# Patient Record
Sex: Female | Born: 1960 | Race: Black or African American | Hispanic: No | Marital: Single | Smoking: Never smoker
Health system: Southern US, Community
[De-identification: ages and names within clinical notes are randomized; demographics above are authoritative.]

## PROBLEM LIST (undated history)

## (undated) DIAGNOSIS — IMO0002 Reserved for concepts with insufficient information to code with codable children: Secondary | ICD-10-CM

## (undated) DIAGNOSIS — D693 Immune thrombocytopenic purpura: Secondary | ICD-10-CM

## (undated) DIAGNOSIS — R87619 Unspecified abnormal cytological findings in specimens from cervix uteri: Secondary | ICD-10-CM

## (undated) DIAGNOSIS — Z0100 Encounter for examination of eyes and vision without abnormal findings: Secondary | ICD-10-CM

## (undated) DIAGNOSIS — M329 Systemic lupus erythematosus, unspecified: Secondary | ICD-10-CM

## (undated) DIAGNOSIS — D259 Leiomyoma of uterus, unspecified: Secondary | ICD-10-CM

## (undated) DIAGNOSIS — Z9289 Personal history of other medical treatment: Secondary | ICD-10-CM

## (undated) DIAGNOSIS — K649 Unspecified hemorrhoids: Secondary | ICD-10-CM

## (undated) DIAGNOSIS — R195 Other fecal abnormalities: Secondary | ICD-10-CM

## (undated) HISTORY — DX: Other fecal abnormalities: R19.5

## (undated) HISTORY — PX: CARPAL TUNNEL RELEASE: SHX101

## (undated) HISTORY — DX: Encounter for examination of eyes and vision without abnormal findings: Z01.00

## (undated) HISTORY — DX: Unspecified abnormal cytological findings in specimens from cervix uteri: R87.619

## (undated) HISTORY — DX: Personal history of other medical treatment: Z92.89

## (undated) HISTORY — PX: PELVIC LAPAROSCOPY: SHX162

---

## 2009-04-21 ENCOUNTER — Ambulatory Visit: Payer: Self-pay | Admitting: Internal Medicine

## 2009-06-05 ENCOUNTER — Ambulatory Visit (HOSPITAL_BASED_OUTPATIENT_CLINIC_OR_DEPARTMENT_OTHER): Admission: RE | Admit: 2009-06-05 | Discharge: 2009-06-05 | Payer: Self-pay | Admitting: Orthopedic Surgery

## 2009-11-30 ENCOUNTER — Ambulatory Visit: Payer: Self-pay | Admitting: Internal Medicine

## 2010-03-19 ENCOUNTER — Ambulatory Visit (HOSPITAL_BASED_OUTPATIENT_CLINIC_OR_DEPARTMENT_OTHER): Admission: RE | Admit: 2010-03-19 | Discharge: 2010-03-19 | Payer: Self-pay | Admitting: Orthopedic Surgery

## 2010-07-13 ENCOUNTER — Ambulatory Visit: Payer: Self-pay | Admitting: Internal Medicine

## 2010-11-01 LAB — POCT HEMOGLOBIN-HEMACUE: Hemoglobin: 15.2 g/dL — ABNORMAL HIGH (ref 12.0–15.0)

## 2010-11-21 LAB — POCT HEMOGLOBIN-HEMACUE: Hemoglobin: 13.8 g/dL (ref 12.0–15.0)

## 2011-02-10 DIAGNOSIS — M329 Systemic lupus erythematosus, unspecified: Secondary | ICD-10-CM | POA: Insufficient documentation

## 2011-03-19 DIAGNOSIS — M12279 Villonodular synovitis (pigmented), unspecified ankle and foot: Secondary | ICD-10-CM | POA: Insufficient documentation

## 2012-09-14 DIAGNOSIS — Z01419 Encounter for gynecological examination (general) (routine) without abnormal findings: Secondary | ICD-10-CM | POA: Insufficient documentation

## 2015-04-07 ENCOUNTER — Inpatient Hospital Stay
Admission: EM | Admit: 2015-04-07 | Discharge: 2015-04-10 | DRG: 813 | Disposition: A | Discharge status: Home / Self Care | Payer: 59 | Attending: Internal Medicine | Admitting: Internal Medicine

## 2015-04-07 ENCOUNTER — Encounter: Payer: Self-pay | Admitting: Gynecology

## 2015-04-07 ENCOUNTER — Ambulatory Visit (INDEPENDENT_AMBULATORY_CARE_PROVIDER_SITE_OTHER)
Admission: EM | Admit: 2015-04-07 | Discharge: 2015-04-07 | Disposition: A | Discharge status: Home / Self Care | Payer: 59 | Source: Home / Self Care | Attending: Family Medicine | Admitting: Family Medicine

## 2015-04-07 ENCOUNTER — Emergency Department: Payer: 59

## 2015-04-07 ENCOUNTER — Encounter: Payer: Self-pay | Admitting: Emergency Medicine

## 2015-04-07 DIAGNOSIS — M3210 Systemic lupus erythematosus, organ or system involvement unspecified: Secondary | ICD-10-CM | POA: Diagnosis present

## 2015-04-07 DIAGNOSIS — D693 Immune thrombocytopenic purpura: Secondary | ICD-10-CM | POA: Diagnosis present

## 2015-04-07 DIAGNOSIS — D696 Thrombocytopenia, unspecified: Secondary | ICD-10-CM

## 2015-04-07 DIAGNOSIS — R509 Fever, unspecified: Secondary | ICD-10-CM

## 2015-04-07 DIAGNOSIS — R161 Splenomegaly, not elsewhere classified: Secondary | ICD-10-CM | POA: Diagnosis present

## 2015-04-07 DIAGNOSIS — Z888 Allergy status to other drugs, medicaments and biological substances status: Secondary | ICD-10-CM

## 2015-04-07 DIAGNOSIS — R197 Diarrhea, unspecified: Secondary | ICD-10-CM | POA: Diagnosis present

## 2015-04-07 DIAGNOSIS — R21 Rash and other nonspecific skin eruption: Secondary | ICD-10-CM | POA: Diagnosis present

## 2015-04-07 DIAGNOSIS — M62838 Other muscle spasm: Secondary | ICD-10-CM | POA: Diagnosis present

## 2015-04-07 DIAGNOSIS — J029 Acute pharyngitis, unspecified: Secondary | ICD-10-CM | POA: Diagnosis present

## 2015-04-07 DIAGNOSIS — Z9889 Other specified postprocedural states: Secondary | ICD-10-CM | POA: Diagnosis not present

## 2015-04-07 DIAGNOSIS — L930 Discoid lupus erythematosus: Secondary | ICD-10-CM | POA: Diagnosis not present

## 2015-04-07 HISTORY — DX: Leiomyoma of uterus, unspecified: D25.9

## 2015-04-07 HISTORY — DX: Unspecified hemorrhoids: K64.9

## 2015-04-07 HISTORY — DX: Systemic lupus erythematosus, unspecified: M32.9

## 2015-04-07 HISTORY — DX: Reserved for concepts with insufficient information to code with codable children: IMO0002

## 2015-04-07 LAB — CBC WITH DIFFERENTIAL/PLATELET
BASOS ABS: 0 10*3/uL (ref 0–0.1)
Basophils Absolute: 0 10*3/uL (ref 0–0.1)
Basophils Relative: 1 %
Basophils Relative: 0 %
EOS ABS: 0.1 10*3/uL (ref 0–0.7)
Eosinophils Absolute: 0.1 10*3/uL (ref 0–0.7)
Eosinophils Relative: 2 %
HCT: 33.9 % — ABNORMAL LOW (ref 35.0–47.0)
HEMATOCRIT: 30.8 % — AB (ref 35.0–47.0)
HEMOGLOBIN: 11.1 g/dL — AB (ref 12.0–16.0)
HEMOGLOBIN: 10.1 g/dL — AB (ref 12.0–16.0)
LYMPHS ABS: 1.1 10*3/uL (ref 1.0–3.6)
LYMPHS ABS: 1 10*3/uL (ref 1.0–3.6)
Lymphocytes Relative: 23 %
MCH: 28.3 pg (ref 26.0–34.0)
MCH: 28.3 pg (ref 26.0–34.0)
MCHC: 32.9 g/dL (ref 32.0–36.0)
MCHC: 32.8 g/dL (ref 32.0–36.0)
MCV: 86.2 fL (ref 80.0–100.0)
MCV: 86.3 fL (ref 80.0–100.0)
Monocytes Absolute: 0.3 10*3/uL (ref 0.2–0.9)
Monocytes Absolute: 0.3 10*3/uL (ref 0.2–0.9)
Monocytes Relative: 6 %
Monocytes Relative: 6 %
NEUTROS ABS: 3.3 10*3/uL (ref 1.4–6.5)
Neutro Abs: 3.3 10*3/uL (ref 1.4–6.5)
Platelets: 8 10*3/uL — CL (ref 150–440)
Platelets: 5 10*3/uL — CL (ref 150–440)
RBC: 3.93 MIL/uL (ref 3.80–5.20)
RBC: 3.57 MIL/uL — AB (ref 3.80–5.20)
RDW: 14.6 % — ABNORMAL HIGH (ref 11.5–14.5)
RDW: 14.5 % (ref 11.5–14.5)
WBC: 4.7 10*3/uL (ref 3.6–11.0)
WBC: 4.5 10*3/uL (ref 3.6–11.0)

## 2015-04-07 LAB — URIC ACID: URIC ACID, SERUM: 3 mg/dL (ref 2.3–6.6)

## 2015-04-07 LAB — COMPREHENSIVE METABOLIC PANEL
ALBUMIN: 3.5 g/dL (ref 3.5–5.0)
ALK PHOS: 51 U/L (ref 38–126)
ALT: 19 U/L (ref 14–54)
AST: 32 U/L (ref 15–41)
Anion gap: 9 (ref 5–15)
BUN: 11 mg/dL (ref 6–20)
CALCIUM: 8.4 mg/dL — AB (ref 8.9–10.3)
CHLORIDE: 102 mmol/L (ref 101–111)
CO2: 24 mmol/L (ref 22–32)
CREATININE: 0.74 mg/dL (ref 0.44–1.00)
GFR calc Af Amer: >60 mL/min (ref >60)
GFR calc non Af Amer: >60 mL/min (ref >60)
GLUCOSE: 122 mg/dL — AB (ref 65–99)
Potassium: 3.8 mmol/L (ref 3.5–5.1)
SODIUM: 135 mmol/L (ref 135–145)
Total Bilirubin: 0.9 mg/dL (ref 0.3–1.2)
Total Protein: 8 g/dL (ref 6.5–8.1)

## 2015-04-07 LAB — URINALYSIS COMPLETE WITH MICROSCOPIC (ARMC ONLY)
BILIRUBIN URINE: NEGATIVE
Glucose, UA: NEGATIVE mg/dL
HGB URINE DIPSTICK: NEGATIVE
KETONES UR: NEGATIVE mg/dL
LEUKOCYTES UA: NEGATIVE
NITRITE: NEGATIVE
PH: 7 (ref 5.0–8.0)
PROTEIN: NEGATIVE mg/dL
SQUAMOUS EPITHELIAL / LPF: NONE SEEN
Specific Gravity, Urine: 1.009 (ref 1.005–1.030)

## 2015-04-07 LAB — PROTIME-INR
INR: 0.99
Prothrombin Time: 13.3 seconds (ref 11.4–15.0)

## 2015-04-07 LAB — LACTATE DEHYDROGENASE: LDH: 217 U/L — AB (ref 98–192)

## 2015-04-07 LAB — RAPID STREP SCREEN (MED CTR MEBANE ONLY): Streptococcus, Group A Screen (Direct): NEGATIVE

## 2015-04-07 LAB — FIBRINOGEN: FIBRINOGEN: 358 mg/dL (ref 210–470)

## 2015-04-07 MED ORDER — METHYLPREDNISOLONE SODIUM SUCC 125 MG IJ SOLR
80.0000 mg | INTRAMUSCULAR | Status: AC
  Administered 2015-04-07: 23:00:00 80 mg via INTRAVENOUS
  Filled 2015-04-07: qty 2

## 2015-04-07 MED ORDER — ACETAMINOPHEN 325 MG PO TABS: 650.0000 mg | ORAL_TABLET | Freq: Four times a day (QID) | ORAL | Status: DC | PRN

## 2015-04-07 MED ORDER — ONDANSETRON HCL 4 MG PO TABS: 4.0000 mg | ORAL_TABLET | Freq: Four times a day (QID) | ORAL | Status: DC | PRN

## 2015-04-07 MED ORDER — DOCUSATE SODIUM 100 MG PO CAPS
100.0000 mg | ORAL_CAPSULE | Freq: Two times a day (BID) | ORAL | Status: DC
  Filled 2015-04-07 (×2): qty 1

## 2015-04-07 MED ORDER — ACETAMINOPHEN 650 MG RE SUPP: 650.0000 mg | Freq: Four times a day (QID) | RECTAL | Status: DC | PRN

## 2015-04-07 MED ORDER — METHYLPREDNISOLONE SODIUM SUCC 125 MG IJ SOLR
80.0000 mg | Freq: Four times a day (QID) | INTRAMUSCULAR | Status: DC
  Administered 2015-04-08 (×3): 80 mg via INTRAVENOUS
  Filled 2015-04-07 (×3): qty 2

## 2015-04-07 MED ORDER — ONDANSETRON HCL 4 MG/2ML IJ SOLN: 4.0000 mg | Freq: Four times a day (QID) | INTRAMUSCULAR | Status: DC | PRN

## 2015-04-07 MED ORDER — HYDROCODONE-ACETAMINOPHEN 5-325 MG PO TABS: 1.0000 | ORAL_TABLET | ORAL | Status: DC | PRN

## 2015-04-07 MED ORDER — DEXTROSE 5 % IV SOLN
1.0000 g | INTRAVENOUS | Status: DC
  Administered 2015-04-07: 23:00:00 1 g via INTRAVENOUS
  Filled 2015-04-07 (×2): qty 10

## 2015-04-07 NOTE — ED Provider Notes (Signed)
SUBJECTIVE:  Dana [name] is a 54 y.o. female who complains of sore throat and headache for the last week. Denies CP, SOB, N/V/D, abdominal pain, severe headache. Has tried OTC Medication without much relief. Started to take old doxycycline 100mg  one tab daily four days ago. Patient has history of lupus.   OBJECTIVE: She appears well, vital signs are as noted.  General: NAD HEENT: mild pharyngeal erythema, no exudate, no erythema of TMs, no cervical LAD Respiratory: CTA B Cardiology: RRR Abdomen: +BS, NT/ND, no guarding or rebound Neurological: CN II-XII grossly intact   ASSESSMENT:  Pharyngitis, Fever, Thrombocytopenia (?Pancytopenia)  PLAN: Rapid strep test was negative, CBC shows thrombocytopenia (possible pancytopenia), I believe she needs to go to the ER for further evaluation and treatment. I am not sure whether the CBC results are due to the doxycycline she started a few days ago. I have called the ER charge nurse. Patient is stable to travel at this time with assistance from a friend by private vehicle.        Paulina Fusi, MD 04/07/15 [phone]

## 2015-04-07 NOTE — ED Notes (Signed)
Attempted to call report at this time was asked to call back

## 2015-04-07 NOTE — ED Notes (Signed)
Patient states she had sore throat and fever x 1 week, went to urgent care, told she had low platelet count.

## 2015-04-07 NOTE — Discharge Instructions (Signed)
Go to [facility] ER now for further work-up and treatment

## 2015-04-07 NOTE — ED Notes (Signed)
Labs not drawn due to results from [place] showing in computer, IV started.

## 2015-04-07 NOTE — ED Notes (Addendum)
Patient c/o sore throat  X 1 week and headache sometimes and a low grade fever.

## 2015-04-07 NOTE — H&P (Signed)
History and Physical    Dana [name] INO:[id number] DOB: 07-21-1961 DOA: 04/07/2015  Referring physician: Dr. Jacqualine Code PCP: Pcp Not In System  Specialists: none  Chief Complaint: Fever  HPI: Dana [name] is a 54 y.o. female has a past medical history significant for lupus who was seen in Urgent Care this AM for fever. Found to be thrombocytopenic. Sent to ER where platelet count was 5K. No bleeding noted. Some vague abdominal pain. Low grade fever.  Review of Systems: The patient denies anorexia,  weight loss,, vision loss, decreased hearing, hoarseness, chest pain, syncope, dyspnea on exertion, peripheral edema, balance deficits, hemoptysis,  melena, hematochezia, severe indigestion/heartburn, hematuria, incontinence, genital sores, muscle weakness, suspicious skin lesions, transient blindness, difficulty walking, depression, unusual weight change, abnormal bleeding, enlarged lymph nodes, angioedema, and breast masses.   Past Medical History  Diagnosis Date  . Lupus   . Uterine fibroid   . Hemorrhoid    Past Surgical History  Procedure Laterality Date  . Pelvic laparoscopy    . Carpal tunnel release      bilateral   Social History:  reports that she has never smoked. She does not have any smokeless tobacco history on file. She reports that she does not drink alcohol or use illicit drugs.  Allergies  Allergen Reactions  . Aspirin     Stomach issue    History reviewed. No pertinent family history.  Prior to Admission medications   Medication Sig Start Date End Date Taking? Authorizing Provider  calcium-vitamin D (OSCAL WITH D) 500-200 MG-UNIT per tablet Take 1 tablet by mouth.    Historical Provider, MD  Multiple Vitamin (MULTIVITAMIN) capsule Take 1 capsule by mouth daily.    Historical Provider, MD   Physical Exam: Filed Vitals:   04/07/15 1621 04/07/15 1956  BP: 140/76 141/70  Pulse: 102 90  Temp: 99.5 F (37.5 C)   TempSrc: Oral   Resp: 20 18  Height: 5\' 8"   (1.727 m)   Weight: 55.792 kg (123 lb)   SpO2: 95% 99%     General:  No apparent distress  Eyes: PERRL, EOMI, no scleral icterus  ENT: moist oropharynx  Neck: supple, no lymphadenopathy  Cardiovascular: regular rate without MRG; 2+ peripheral pulses, no JVD, no peripheral edema  Respiratory: CTA biL, good air movement without wheezing, rhonchi or crackled  Abdomen: soft, non tender to palpation, positive bowel sounds, no guarding, no rebound  Skin: no rashes  Musculoskeletal: normal bulk and tone, no joint swelling  Psychiatric: normal mood and affect  Neurologic: CN 2-12 grossly intact, MS 5/5 in all 4  Labs on Admission:  Basic Metabolic Panel: No results for input(s): NA, K, CL, CO2, GLUCOSE, BUN, CREATININE, CALCIUM, MG, PHOS in the last 168 hours. Liver Function Tests: No results for input(s): AST, ALT, ALKPHOS, BILITOT, PROT, ALBUMIN in the last 168 hours. No results for input(s): LIPASE, AMYLASE in the last 168 hours. No results for input(s): AMMONIA in the last 168 hours. CBC:  Recent Labs Lab 04/07/15 1354 04/07/15 1837  WBC 4.7 4.5  NEUTROABS 3.3 3.3  HGB 11.1* 10.1*  HCT 33.9* 30.8*  MCV 86.2 86.3  PLT 8* 5*   Cardiac Enzymes: No results for input(s): CKTOTAL, CKMB, CKMBINDEX, TROPONINI in the last 168 hours.  BNP (last 3 results) No results for input(s): BNP in the last 8760 hours.  ProBNP (last 3 results) No results for input(s): PROBNP in the last 8760 hours.  CBG: No results for input(s): GLUCAP in  the last 168 hours.  Radiological Exams on Admission: Dg Chest 2 View  04/07/2015   CLINICAL DATA:  Sore throat and low grade fever today.  EXAM: CHEST  2 VIEW  COMPARISON:  None.  FINDINGS: The heart size and mediastinal contours are within normal limits. There is no focal infiltrate, pulmonary edema, or pleural effusion. There is scoliosis of spine.  IMPRESSION: No active cardiopulmonary disease.   Electronically Signed   By: Abelardo Diesel M.D.    On: 04/07/2015 20:32    EKG: Independently reviewed.  Assessment/Plan Principal Problem:   Thrombocytopenia Active Problems:   Fever   Will begin IV steroids and US spleen. Consult Hematology. Hold off on transfusion for now. Specialty labs have been sent off.  Diet: clear liq Fluids: none DVT Prophylaxis: none  Code Status: FULL   Family Communication: yes  Disposition Plan: home  Time spent: 50 min

## 2015-04-07 NOTE — ED Provider Notes (Addendum)
[facility] Emergency Department Provider Note  ____________________________________________  Time seen: Approximately 8:42 PM  I have reviewed the triage vital signs and the nursing notes.   HISTORY  Chief Complaint Abnormal Lab    HPI Dana [name] is a 54 y.o. female history of lupus currently not any medications except doxycycline which is taken for last few days. She reports she had a sore throat for about a week, she reports occasional mild headache, no shortness of breath no nausea no vomiting, occasional loose stool. Continue over-the-counter medication, and went to urgent care today for ongoing symptomatology and feeling generally ill. She had a low-grade temperature she reports that about 100, but otherwise reports she is feeling okay except they're concerned about a low platelet count.   Denies being in pain. She's had occasional slight headaches off and on along with the low-grade temperature.  Past Medical History  Diagnosis Date  . Lupus   . Uterine fibroid   . Hemorrhoid     Patient Active Problem List   Diagnosis Date Noted  . Thrombocytopenia 04/07/2015  . Fever 04/07/2015    Past Surgical History  Procedure Laterality Date  . Pelvic laparoscopy    . Carpal tunnel release      bilateral    Current Outpatient Rx  Name  Route  Sig  Dispense  Refill  . acetaminophen (TYLENOL) 500 MG tablet   Oral   Take 500 mg by mouth every 6 (six) hours as needed.         . calcium-vitamin D (OSCAL WITH D) 500-200 MG-UNIT per tablet   Oral   Take 1 tablet by mouth.         . Multiple Vitamin (MULTIVITAMIN) capsule   Oral   Take 1 capsule by mouth daily.         Marland Kitchen doxycycline (ADOXA) 100 MG tablet   Oral   Take 100 mg by mouth 2 (two) times daily.           Allergies Aspirin and Latex  History reviewed. No pertinent family history.  Social History Social History  Substance Use Topics  . Smoking status: Never Smoker    . Smokeless tobacco: None  . Alcohol Use: No    Review of Systems Constitutional: Low-grade fever Eyes: No visual changes. ENT: No sore throat. Cardiovascular: Denies chest pain. Respiratory: Denies shortness of breath. Gastrointestinal: No abdominal pain.  Some nausea  No diarrhea.  No constipation. Genitourinary: Negative for dysuria. Denies pregnancy. Musculoskeletal: Negative for back pain. Skin: Negative for rash. Neurological: Negative for headaches, focal weakness or numbness.  10-point ROS otherwise negative.  ____________________________________________   PHYSICAL EXAM:  VITAL SIGNS: ED Triage Vitals  Enc Vitals Group     BP 04/07/15 1621 140/76 mmHg     Pulse Rate 04/07/15 1621 102     Resp 04/07/15 1621 20     Temp 04/07/15 1621 99.5 F (37.5 C)     Temp Source 04/07/15 1621 Oral     SpO2 04/07/15 1621 95 %     Weight 04/07/15 1621 123 lb (55.792 kg)     Height 04/07/15 1621 5\' 8"  (1.727 m)     Head Cir --      Peak Flow --      Pain Score 04/07/15 1622 3     Pain Loc --      Pain Edu? --      Excl. in [place]? --     Constitutional: Alert  and oriented. Well appearing and in no acute distress. Eyes: Conjunctivae are normal. PERRL. EOMI. Head: Atraumatic. Nose: No congestion/rhinnorhea. No tonsillar exudates. Mouth/Throat: Mucous membranes are moist.  Oropharynx minimal erythema. Neck: No stridor.   Cardiovascular: Normal rate, regular rhythm. Grossly normal heart sounds.  Good peripheral circulation. Respiratory: Normal respiratory effort.  No retractions. Lungs CTAB. Gastrointestinal: Soft and nontender. No distention. No abdominal bruits. No CVA tenderness. Musculoskeletal: No lower extremity tenderness nor edema.  No joint effusions. Neurologic:  Normal speech and language. No gross focal neurologic deficits are appreciated. Skin:  Skin is warm, dry and intact. No rash noted. No ecchymosis, except for a small area where she had blood drawn  earlier. Psychiatric: Mood and affect are normal. Speech and behavior are normal.  ____________________________________________   LABS (all labs ordered are listed, but only abnormal results are displayed)  Labs Reviewed  CBC WITH DIFFERENTIAL/PLATELET - Abnormal; Notable for the following:    RBC 3.57 (*)    Hemoglobin 10.1 (*)    HCT 30.8 (*)    Platelets 5 (*)    All other components within normal limits  COMPREHENSIVE METABOLIC PANEL - Abnormal; Notable for the following:    Glucose, Bld 122 (*)    Calcium 8.4 (*)    All other components within normal limits  LACTATE DEHYDROGENASE - Abnormal; Notable for the following:    LDH 217 (*)    All other components within normal limits  STOOL CULTURE  STOOL CULTURE  CULTURE, BLOOD (ROUTINE X 2)  CULTURE, BLOOD (ROUTINE X 2)  PROTIME-INR  FIBRINOGEN  URIC ACID  PATHOLOGIST SMEAR REVIEW  LUPUS ANTICOAGULANT PANEL  URINALYSIS COMPLETEWITH MICROSCOPIC ([facility] ONLY)   ____________________________________________  EKG   ____________________________________________  RADIOLOGY   ____________________________________________   PROCEDURES  Procedure(s) performed: None  Critical Care performed: No  ____________________________________________   INITIAL IMPRESSION / ASSESSMENT AND PLAN / ED COURSE  Pertinent labs & imaging results that were available during my care of the patient were reviewed by me and considered in my medical decision making (see chart for details).  Patient presents with generalized viral type symptoms, but noted to have severe Thomas cytopenia. Discussed with Dr. Mike Gip would rises admission, hematology consultation, and give me a battery of lab on which I have done so. In the ER, does not appear that she has any evidence of acute infection other than low-grade temperature, and her exam is very reassuring, denies any bloody or black stools, no altered mental status.  We will clearly admit the  patient, she needs a hematology consultation in follow up on remaining labs which Dr. Doy Hutching will follow up on. Differential diagnosis would certainly include ITP, autoimmune thrombocytopenia, viral suppression, possible malignancy, Escherichia coli HUS. The patient has no altered mental status, I do not see any evidence based on exam of hemolytic uremic syndrome but we are awaiting labs including chemistry creatinine which are internal medicine service will continue to follow on. ____________________________________________   FINAL CLINICAL IMPRESSION(S) / ED DIAGNOSES  Final diagnoses:  Severe thrombocytopenia      Delman Kitten, MD 04/07/15 2046  Delman Kitten, MD 04/07/15 2148

## 2015-04-08 DIAGNOSIS — D696 Thrombocytopenia, unspecified: Secondary | ICD-10-CM

## 2015-04-08 DIAGNOSIS — L930 Discoid lupus erythematosus: Secondary | ICD-10-CM

## 2015-04-08 LAB — TYPE AND SCREEN
ABO/RH(D): O POS
Antibody Screen: NEGATIVE

## 2015-04-08 LAB — COMPREHENSIVE METABOLIC PANEL
ALT: 19 U/L (ref 14–54)
AST: 29 U/L (ref 15–41)
Albumin: 3.2 g/dL — ABNORMAL LOW (ref 3.5–5.0)
Alkaline Phosphatase: 50 U/L (ref 38–126)
Anion gap: 8 (ref 5–15)
BILIRUBIN TOTAL: 0.9 mg/dL (ref 0.3–1.2)
BUN: 9 mg/dL (ref 6–20)
CHLORIDE: 104 mmol/L (ref 101–111)
CO2: 25 mmol/L (ref 22–32)
Calcium: 8.5 mg/dL — ABNORMAL LOW (ref 8.9–10.3)
Creatinine, Ser: 0.73 mg/dL (ref 0.44–1.00)
Glucose, Bld: 143 mg/dL — ABNORMAL HIGH (ref 65–99)
POTASSIUM: 4.5 mmol/L (ref 3.5–5.1)
Sodium: 137 mmol/L (ref 135–145)
TOTAL PROTEIN: 7.9 g/dL (ref 6.5–8.1)

## 2015-04-08 LAB — CBC
HCT: 31.9 % — ABNORMAL LOW (ref 35.0–47.0)
HEMATOCRIT: 32.5 % — AB (ref 35.0–47.0)
Hemoglobin: 10.4 g/dL — ABNORMAL LOW (ref 12.0–16.0)
Hemoglobin: 10.6 g/dL — ABNORMAL LOW (ref 12.0–16.0)
MCH: 27.9 pg (ref 26.0–34.0)
MCH: 28.6 pg (ref 26.0–34.0)
MCHC: 32.1 g/dL (ref 32.0–36.0)
MCHC: 33.3 g/dL (ref 32.0–36.0)
MCV: 86.9 fL (ref 80.0–100.0)
MCV: 86 fL (ref 80.0–100.0)
PLATELETS: 6 10*3/uL — AB (ref 150–440)
Platelets: 7 10*3/uL — CL (ref 150–440)
RBC: 3.74 MIL/uL — ABNORMAL LOW (ref 3.80–5.20)
RBC: 3.71 MIL/uL — ABNORMAL LOW (ref 3.80–5.20)
RDW: 14.6 % — AB (ref 11.5–14.5)
RDW: 14.3 % (ref 11.5–14.5)
WBC: 3.7 10*3/uL (ref 3.6–11.0)
WBC: 3.4 10*3/uL — ABNORMAL LOW (ref 3.6–11.0)

## 2015-04-08 LAB — ABO/RH: ABO/RH(D): O POS

## 2015-04-08 LAB — FOLATE: Folate: 24.2 ng/mL (ref >5.9)

## 2015-04-08 LAB — TSH: TSH: 0.717 u[IU]/mL (ref 0.350–4.500)

## 2015-04-08 MED ORDER — METHYLPREDNISOLONE SODIUM SUCC 125 MG IJ SOLR
60.0000 mg | Freq: Two times a day (BID) | INTRAMUSCULAR | Status: DC
  Administered 2015-04-09 – 2015-04-10 (×3): 60 mg via INTRAVENOUS
  Filled 2015-04-08 (×3): qty 2

## 2015-04-08 MED ORDER — PANTOPRAZOLE SODIUM 40 MG PO TBEC
40.0000 mg | DELAYED_RELEASE_TABLET | Freq: Every day | ORAL | Status: DC
  Administered 2015-04-08: 40 mg via ORAL
  Filled 2015-04-08: qty 1

## 2015-04-08 NOTE — Progress Notes (Signed)
Spoke with Dr. Mike Gip.  Notified of critical Platelet count of 7,000.  MD verbalized understanding.  Orders given to repeat CBC daily, Protonix 40mg  daily, and change Solumedrol to 60mg  q12 hours. Orders entered. [place] 5:59 PM
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
## 2015-04-08 NOTE — Progress Notes (Signed)
[facility] [place] NAME: Dana [name]    MR#:  [id number]  DATE OF BIRTH:  03-11-1961  SUBJECTIVE:  Patient presented with fever at urgent care subsequently noted to have thrombocytopenia. Patient does not take any medications such as Plavix or aspirin. She has no petechiae  REVIEW OF SYSTEMS:    Review of Systems  Constitutional: Negative for fever, chills and malaise/fatigue.  HENT: Negative for sore throat.   Eyes: Negative for blurred vision.  Respiratory: Negative for cough, hemoptysis, shortness of breath and wheezing.   Cardiovascular: Negative for chest pain, palpitations and leg swelling.  Gastrointestinal: Negative for nausea, vomiting, abdominal pain, diarrhea and blood in stool.  Genitourinary: Negative for dysuria.  Musculoskeletal: Negative for back pain.  Neurological: Negative for dizziness, tremors and headaches.  Endo/Heme/Allergies: Does not bruise/bleed easily.    Tolerating Diet: Yes      DRUG ALLERGIES:   Allergies  Allergen Reactions  . Aspirin     Stomach issue  . Latex Swelling    Patient said she can use latex gloves now.    VITALS:  Blood pressure 112/66, pulse 78, temperature 98.4 F (36.9 C), temperature source Oral, resp. rate 18, height 5\' 8"  (1.727 m), weight 58.514 kg (129 lb), SpO2 99 %.  PHYSICAL EXAMINATION:   Physical Exam  Constitutional: She is oriented to person, place, and time and well-developed, well-nourished, and in no distress. No distress.  HENT:  Head: Normocephalic.  Eyes: No scleral icterus.  Neck: Normal range of motion. Neck supple. No JVD present. No tracheal deviation present.  Cardiovascular: Normal rate, regular rhythm and normal heart sounds.  Exam reveals no gallop and no friction rub.   No murmur heard. Pulmonary/Chest: Effort normal and breath sounds normal. No respiratory distress. She has no wheezes. She has no rales. She exhibits no tenderness.   Abdominal: Soft. Bowel sounds are normal. She exhibits no distension and no mass. There is no tenderness. There is no rebound and no guarding.  Musculoskeletal: Normal range of motion. She exhibits no edema.  Neurological: She is alert and oriented to person, place, and time.  Skin: Skin is warm. No rash noted. No erythema.  Psychiatric: Affect and judgment normal.      LABORATORY PANEL:   CBC  Recent Labs Lab 04/08/15 0416  WBC 3.7  HGB 10.4*  HCT 32.5*  PLT 6*   ------------------------------------------------------------------------------------------------------------------  Chemistries   Recent Labs Lab 04/08/15 0416  NA 137  K 4.5  CL 104  CO2 25  GLUCOSE 143*  BUN 9  CREATININE 0.73  CALCIUM 8.5*  AST 29  ALT 19  ALKPHOS 50  BILITOT 0.9   ------------------------------------------------------------------------------------------------------------------  Cardiac Enzymes No results for input(s): TROPONINI in the last 168 hours. ------------------------------------------------------------------------------------------------------------------  RADIOLOGY:  Dg Chest 2 View  04/07/2015   CLINICAL DATA:  Sore throat and low grade fever today.  EXAM: CHEST  2 VIEW  COMPARISON:  None.  FINDINGS: The heart size and mediastinal contours are within normal limits. There is no focal infiltrate, pulmonary edema, or pleural effusion. There is scoliosis of spine.  IMPRESSION: No active cardiopulmonary disease.   Electronically Signed   By: Abelardo Diesel M.D.   On: 04/07/2015 20:32   US Abdomen Complete  04/07/2015   IMPRESSION: 1. Normal spleen size and appearance. 2. Cholelithiasis.   Electronically Signed   By: Monte Fantasia M.D.   On: 04/07/2015 22:13     ASSESSMENT AND PLAN:  54 year old female with a history of lupus who presented to urgent care with fever and subsequently noted to have thrombocytopenia.  1. 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 Lab work is pending at this time. Oncology has been consulted for further evaluation and management. Abdominal ultrasound shows normal spleen. Continue IV steroids.  2. LUPUS: she is not on medications at this time. She follows in [place] [facility] [place].     Management plans discussed with the patient and she is in agreement.  CODE STATUS: FULL  TOTAL TIME TAKING CARE OF THIS PATIENT: 30 minutes.     POSSIBLE D/C 1-2 days, DEPENDING ON CLINICAL CONDITION.   Izzabelle Bouley M.D on 04/08/2015 at 9:18 AM  Between 7am to 6pm - Pager - [phone] After 6pm go to [email or web address] - password [email or web address] [facility]  Office  [phone]  CC: Primary care physician; Pcp Not In System

## 2015-04-08 NOTE — Plan of Care (Signed)
Problem: Discharge Progression Outcomes Goal: Other Discharge Outcomes/Goals Outcome: Progressing Patient admitted for thrombocytopenia platelet count this morning is 6, no transfusion ordered at this time, no active bleeding noted, stool culture sent to lab results pending. VSS, IV antibiotics and IV solumedrol given per order. Continue to monitor.

## 2015-04-09 LAB — CBC WITH DIFFERENTIAL/PLATELET
Basophils Absolute: 0.2 10*3/uL — ABNORMAL HIGH (ref 0–0.1)
Basophils Relative: 3 %
Eosinophils Absolute: 0 10*3/uL (ref 0–0.7)
Eosinophils Relative: 0 %
HCT: 31 % — ABNORMAL LOW (ref 35.0–47.0)
Hemoglobin: 10.2 g/dL — ABNORMAL LOW (ref 12.0–16.0)
Lymphocytes Relative: 9 %
Lymphs Abs: 0.7 10*3/uL — ABNORMAL LOW (ref 1.0–3.6)
MCH: 28.3 pg (ref 26.0–34.0)
MCHC: 32.8 g/dL (ref 32.0–36.0)
MCV: 86.2 fL (ref 80.0–100.0)
Monocytes Absolute: 0.5 10*3/uL (ref 0.2–0.9)
Monocytes Relative: 6 %
Neutro Abs: 6.9 10*3/uL — ABNORMAL HIGH (ref 1.4–6.5)
Neutrophils Relative %: 82 %
Platelets: 10 10*3/uL — CL (ref 150–440)
RBC: 3.59 MIL/uL — ABNORMAL LOW (ref 3.80–5.20)
RDW: 14.6 % — ABNORMAL HIGH (ref 11.5–14.5)
WBC: 8.4 10*3/uL (ref 3.6–11.0)

## 2015-04-09 LAB — VITAMIN B12: Vitamin B-12: 367 pg/mL (ref 180–914)

## 2015-04-09 MED ORDER — BOOST / RESOURCE BREEZE PO LIQD
1.0000 | Freq: Two times a day (BID) | ORAL | Status: DC
  Administered 2015-04-09 – 2015-04-10 (×2): 1 via ORAL

## 2015-04-09 NOTE — Care Management Note (Addendum)
Case Management Note  Patient Details  Name: JONNELL HENTGES MRN: [id number] Date of Birth: [birth year]  Subjective/Objective:   54yo Ms Dealva Lafoy was admitted 04/07/15 per thrombocytopenia and possible lupus flare. PCP=Dr [employer] in [place], [place]. Pharmacy=[facility] in [place]. Resides alone but will have family members staying with her at home after hospital discharge. Currently has no home oxygen and no home equipment. Family and friends will provide transportation to appointments until she is driving again. Case Management will follow for discharge planning.    Action/Plan:   Expected Discharge Date:                  Expected Discharge Plan:     In-House Referral:     Discharge planning Services     Post Acute Care Choice:    Choice offered to:     DME Arranged:    DME Agency:     HH Arranged:    [place] Agency:     Status of Service:     Medicare Important Message Given:    Date Medicare IM Given:    Medicare IM give by:    Date Additional Medicare IM Given:    Additional Medicare Important Message give by:     If discussed at [place] of Stay Meetings, dates discussed:    Additional Comments:  Jatavious Peppard A, RN 04/09/2015, 2:16 PM

## 2015-04-09 NOTE — Progress Notes (Signed)
[place]  [phone] [phone]  ID: Dana [name] OB: [birth year]  MR#: [id number]  YKD#:[id number]  Patient Care Team: Pcp Not In System as PCP - General  CHIEF COMPLAINT:  Chief Complaint  Patient presents with  . Abnormal Lab    low platelet count    INTERVAL HISTORY: Patient doing well today and is up at bedside eating dinner. She had significant cramping overnight which she blames on Protonix, but otherwise is feeling well. She denies any easy bleeding or bruising. She has no neurologic complaints. She denies any chest pain or shortness of breath. Patient otherwise feels well and offers no further specific complaints.   REVIEW OF SYSTEMS:   Review of Systems  Constitutional: Negative.   Endo/Heme/Allergies: Does not bruise/bleed easily.    As per HPI. Otherwise, a complete review of systems is negatve.  PAST MEDICAL HISTORY: Past Medical History  Diagnosis Date  . Lupus   . Uterine fibroid   . Hemorrhoid     PAST SURGICAL HISTORY: Past Surgical History  Procedure Laterality Date  . Pelvic laparoscopy    . Carpal tunnel release      bilateral    FAMILY HISTORY History reviewed. No pertinent family history.     ADVANCED DIRECTIVES:    HEALTH MAINTENANCE: Social History  Substance Use Topics  . Smoking status: Never Smoker   . Smokeless tobacco: None  . Alcohol Use: No     Colonoscopy:  PAP:  Bone density:  Lipid panel:  Allergies  Allergen Reactions  . Aspirin     Stomach issue  . Latex Swelling    Patient said she can use latex gloves now.    Current Facility-Administered Medications  Medication Dose Route Frequency Provider Last Rate Last Dose  . acetaminophen (TYLENOL) tablet 650 mg  650 mg Oral Q6H PRN Idelle Crouch, MD       Or  . acetaminophen (TYLENOL) suppository 650 mg  650 mg Rectal Q6H PRN Idelle Crouch, MD      . docusate sodium (COLACE) capsule 100 mg  100 mg Oral BID Idelle Crouch, MD   100 mg at 04/07/15 2234  . feeding supplement (BOOST / RESOURCE BREEZE) liquid 1 Container  1 Container Oral BID BM Bettey Costa, MD      . HYDROcodone-acetaminophen (NORCO/VICODIN) 5-325 MG per tablet 1-2 tablet  1-2 tablet Oral Q4H PRN Idelle Crouch, MD      . methylPREDNISolone sodium succinate (SOLU-MEDROL) 125 mg/2 mL injection 60 mg  60 mg Intravenous Q12H Lequita Asal, MD   60 mg at 04/09/15 1708  . ondansetron (ZOFRAN) tablet 4 mg  4 mg Oral Q6H PRN Idelle Crouch, MD       Or  . ondansetron [facility]) injection 4 mg  4 mg Intravenous Q6H PRN Idelle Crouch, MD        OBJECTIVE: Filed Vitals:   04/09/15 1151  BP: 128/68  Pulse: 74  Temp: 97.9 F (36.6 C)  Resp: 19     Body mass index is 19.74 kg/(m^2).    ECOG FS:0 - Asymptomatic  General: Well-developed, well-nourished, no acute distress. Eyes: Pink conjunctiva, anicteric sclera. Lungs: Clear to auscultation bilaterally. Heart: Regular rate and rhythm. No rubs, murmurs, or gallops. Abdomen: Soft, nontender, nondistended. No organomegaly noted, normoactive bowel sounds. Musculoskeletal: No edema, cyanosis, or clubbing. Neuro: Alert, answering all questions appropriately. Cranial nerves grossly intact. Skin: No rashes or petechiae noted. Psych: Normal affect.  LAB RESULTS:  Lab Results  Component Value Date   NA 137 04/08/2015   K 4.5 04/08/2015   CL 104 04/08/2015   CO2 25 04/08/2015   GLUCOSE 143* 04/08/2015   BUN 9 04/08/2015   CREATININE 0.73 04/08/2015   CALCIUM 8.5* 04/08/2015   PROT 7.9 04/08/2015   ALBUMIN 3.2* 04/08/2015   AST 29 04/08/2015   ALT 19 04/08/2015   ALKPHOS 50 04/08/2015   BILITOT 0.9 04/08/2015   GFRNONAA >60 04/08/2015   GFRAA >60 04/08/2015    Lab Results  Component Value Date   WBC 8.4 04/09/2015   NEUTROABS 6.9* 04/09/2015   HGB 10.2* 04/09/2015   HCT 31.0* 04/09/2015   MCV 86.2 04/09/2015   PLT 10* 04/09/2015     STUDIES: Dg Chest 2  View  04/07/2015   CLINICAL DATA:  Sore throat and low grade fever today.  EXAM: CHEST  2 VIEW  COMPARISON:  None.  FINDINGS: The heart size and mediastinal contours are within normal limits. There is no focal infiltrate, pulmonary edema, or pleural effusion. There is scoliosis of spine.  IMPRESSION: No active cardiopulmonary disease.   Electronically Signed   By: Abelardo Diesel M.D.   On: 04/07/2015 20:32   US Abdomen Complete  04/07/2015   CLINICAL DATA:  Fever and headache. Sore throat. Thrombocytopenia, evaluate splenic size.  EXAM: ULTRASOUND ABDOMEN COMPLETE  COMPARISON:  None.  FINDINGS: Gallbladder: Stone in the gallbladder neck with gallbladder contraction. No focal tenderness.  Common bile duct: Diameter: 3 mm. Where visualized, no filling defect.  Liver: No focal lesion identified. Within normal limits in parenchymal echogenicity. Antegrade flow in the imaged portal venous system.  IVC: No abnormality visualized.  Pancreas: Visualized portion unremarkable.  Spleen: Size and appearance within normal limits. As requested, splenic size obtained with a 96 cc volume.  Right Kidney: Length: 10 cm. Echogenicity within normal limits. No mass or hydronephrosis visualized.  Left Kidney: Length: 10 cm. Echogenicity within normal limits. No mass or hydronephrosis visualized.  Abdominal aorta: No aneurysm visualized.  IMPRESSION: 1. Normal spleen size and appearance. 2. Cholelithiasis.   Electronically Signed   By: Monte Fantasia M.D.   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  PLAN:    1. ITP: Patient platelet count trending up and is now 10. Continue IV Solu-Medrol as ordered. Previously discussed IVIG. Also can consider Rituxan weekly. Continue to monitor daily CBC, patient will likely remain inpatient until platelet count is greater than 20 followed by outpatient steroid taper. Patient does not require platelet transfusion at this time. Recommend 1 hour posttransfusion  platelet count if transfusion needed. 2. Omeprazole for GI prophylaxis. 3. Guaiac all stools. Stool for Ecoli 0157 4. Labs: Type and screen, B12, folate, TSH, H pylori serologies, immunoglobulins (check IgA if IVIG given), hepatitis B and C testing, HIV testing (patient consented). Follow-up lupus anticoagulant testing   Appreciate consult, will follow.   Lloyd Huger, MD   04/09/2015 6:06 PM

## 2015-04-09 NOTE — Progress Notes (Signed)
   04/09/15 1335  Clinical Encounter Type  Visited With Patient  Visit Type Initial  Spiritual Encounters  Spiritual Needs Prayer  Stress Factors  Patient Stress Factors Health changes  Chaplain rounded in unit and prayed with patient.   Chaplain Ellianne Gowen [phone]

## 2015-04-09 NOTE — Progress Notes (Addendum)
Initial Nutrition Assessment   INTERVENTION:   Meals and Snacks: Cater to patient preferences Medical Food Supplement Therapy: will recommend Boost Breeze po BID, each supplement provides 250 kcal and 9 grams of protein   NUTRITION DIAGNOSIS:   Inadequate oral intake related to poor appetite as evidenced by per patient/family report.  GOAL:   Patient will meet greater than or equal to 90% of their needs  MONITOR:    (Energy Intake, Electrolyte and renal Profile, Anthropometrics, Digestive System)  REASON FOR ASSESSMENT:   Malnutrition Screening Tool    ASSESSMENT:   Pt admitted with low platelets and thrombocytopenia.   Past Medical History  Diagnosis Date  . Lupus   . Uterine fibroid   . Hemorrhoid      Diet Order:  Diet regular Room service appropriate?: Yes; Fluid consistency:: Thin    Current Nutrition: Pt reports po intake much improved since started on steroid since admission. Pt reports eating 100% of beef barley soup and sandwich at lunch, and 100% of breakfast, cream of wheat and egg sandwich this am. Pt eating 100% of lunch and dinner yesterday as well recorded.  Food/Nutrition-Related History: Pt reports for the past week, 'making herself eat.' Pt reports poor appetite but still making sure to eat a little something. Pt reports prior to that having a 'stomach bug,' and not eating well then either.    Medications: Solumedrol, colace  Electrolyte/Renal Profile and Glucose Profile:   Recent Labs Lab 04/07/15 1944 04/08/15 0416  NA 135 137  K 3.8 4.5  CL 102 104  CO2 24 25  BUN 11 9  CREATININE 0.74 0.73  CALCIUM 8.4* 8.5*  GLUCOSE 122* 143*   Protein Profile:  Recent Labs Lab 04/07/15 1944 04/08/15 0416  ALBUMIN 3.5 3.2*   Nutritional Anemia Profile:  CBC Latest Ref Rng 04/09/2015 04/08/2015 04/08/2015  WBC 3.6 - 11.0 K/uL 8.4 3.4(L) 3.7  Hemoglobin 12.0 - 16.0 g/dL 10.2(L) 10.6(L) 10.4(L)  Hematocrit 35.0 - 47.0 % 31.0(L) 31.9(L) 32.5(L)   Platelets 150 - 440 K/uL 10(LL) 7(LL) 6(LL)    Gastrointestinal Profile: Last BM: 04/08/2015  Skin:   (Reviewed, petechiae and jaundice noted)   Nutrition-Focused Physical Exam Findings:  Unable to complete Nutrition-Focused physical exam at this time.    Weight Change: Pt reports 6lbs weight loss for the past couple of weeks, per [facility] weight gain.    Height:   Ht Readings from Last 1 Encounters:  04/07/15 5\' 8"  (1.727 m)    Weight:   Wt Readings from Last 1 Encounters:  04/09/15 129 lb 12.8 oz (58.877 kg)    Wt Readings from Last 10 Encounters:  04/09/15 129 lb 12.8 oz (58.877 kg)  04/07/15 123 lb (55.792 kg)    BMI:  Body mass index is 19.74 kg/(m^2).  Estimated Nutritional Needs:   Kcal:  1755-2074kcals, BEE: 1228kcals, TEE: (IF 1.1-1.3)(AF 1.3)   Protein:  47-59g protein (0.8-1.0g/kg)   Fluid:  1473-1725mL o fluid (25-26mL/kg)   EDUCATION NEEDS:   No education needs identified at this time   [place], RD, LDN Pager [phone]

## 2015-04-09 NOTE — Plan of Care (Signed)
Problem: Discharge Progression Outcomes Goal: Other Discharge Outcomes/Goals Outcome: Progressing Patient admitted for thrombocytopenia platelet count this morning is 10, no transfusion ordered at this time, no active bleeding noted. VSS.  Continue to monitor.

## 2015-04-09 NOTE — Progress Notes (Signed)
[place] at [place] NAME: Jamayia Croker    MR#:  [id number]  DATE OF BIRTH:  1960-09-04  SUBJECTIVE:  Patient says the Protonix caused her muscle spasms at night and was unable to tolerate this and will not take this again. She also has a rash on her face  REVIEW OF SYSTEMS:    Review of Systems  Constitutional: Negative for fever, chills and malaise/fatigue.  HENT: Negative for sore throat.   Eyes: Negative for blurred vision.  Respiratory: Negative for cough, hemoptysis, shortness of breath and wheezing.   Cardiovascular: Negative for chest pain, palpitations and leg swelling.  Gastrointestinal: Negative for nausea, vomiting, abdominal pain, diarrhea and blood in stool.  Genitourinary: Negative for dysuria.  Musculoskeletal: Negative for back pain.  Skin: Positive for rash.       Rash noted on face no petiachae  Neurological: Negative for dizziness, tremors and headaches.  Endo/Heme/Allergies: Does not bruise/bleed easily.    Tolerating Diet: Yes      DRUG ALLERGIES:   Allergies  Allergen Reactions  . Aspirin     Stomach issue  . Latex Swelling    Patient said she can use latex gloves now.    VITALS:  Blood pressure 116/72, pulse 75, temperature 98 F (36.7 C), temperature source Oral, resp. rate 20, height 5\' 8"  (1.727 m), weight 58.877 kg (129 lb 12.8 oz), SpO2 100 %.  PHYSICAL EXAMINATION:   Physical Exam  Constitutional: She is oriented to person, place, and time and well-developed, well-nourished, and in no distress. No distress.  HENT:  Head: Normocephalic.  Eyes: No scleral icterus.  Neck: Normal range of motion. Neck supple. No JVD present. No tracheal deviation present.  Cardiovascular: Normal rate, regular rhythm and normal heart sounds.  Exam reveals no gallop and no friction rub.   No murmur heard. Pulmonary/Chest: Effort normal and breath sounds normal. No respiratory distress. She has no  wheezes. She has no rales. She exhibits no tenderness.  Abdominal: Soft. Bowel sounds are normal. She exhibits no distension and no mass. There is no tenderness. There is no rebound and no guarding.  Musculoskeletal: Normal range of motion. She exhibits no edema.  Neurological: She is alert and oriented to person, place, and time.  Skin: Skin is warm. Rash (face) noted. No erythema.  Psychiatric: Affect and judgment normal.      LABORATORY PANEL:   CBC  Recent Labs Lab 04/09/15 0517  WBC 8.4  HGB 10.2*  HCT 31.0*  PLT 10*   ------------------------------------------------------------------------------------------------------------------  Chemistries   Recent Labs Lab 04/08/15 0416  NA 137  K 4.5  CL 104  CO2 25  GLUCOSE 143*  BUN 9  CREATININE 0.73  CALCIUM 8.5*  AST 29  ALT 19  ALKPHOS 50  BILITOT 0.9   ------------------------------------------------------------------------------------------------------------------  Cardiac Enzymes No results for input(s): TROPONINI in the last 168 hours. ------------------------------------------------------------------------------------------------------------------  RADIOLOGY:  Dg Chest 2 View  04/07/2015   CLINICAL DATA:  Sore throat and low grade fever today.  EXAM: CHEST  2 VIEW  COMPARISON:  None.  FINDINGS: The heart size and mediastinal contours are within normal limits. There is no focal infiltrate, pulmonary edema, or pleural effusion. There is scoliosis of spine.  IMPRESSION: No active cardiopulmonary disease.   Electronically Signed   By: Abelardo Diesel M.D.   On: 04/07/2015 20:32   US Abdomen Complete  04/07/2015   IMPRESSION: 1. Normal spleen size and appearance. 2. Cholelithiasis.  Electronically Signed   By: Monte Fantasia M.D.   On: 04/07/2015 22:13     ASSESSMENT AND PLAN:   54 year old female with a history of lupus who presented to urgent care with fever and subsequently noted to have  thrombocytopenia.  1. 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 Abdominal ultrasound shows normal spleen. Continue IV steroids and assess daily for need for IVIG. Oncology consultation is appreciated. Once platelets are greater than 20,000 patient will be able to be discharged with outpatient steroids taper. H. pylori serologies are pending immunoglobulins are pending. TSH normal   2. LUPUS: she is not on medications at this time.  3. Face rash: Patient does not say this is related to her lupus. I will continue to monitor.     Management plans discussed with the patient and she is in agreement.  CODE STATUS: FULL  TOTAL TIME TAKING CARE OF THIS PATIENT: 30 minutes.     POSSIBLE D/C 1-2 days, DEPENDING ON CLINICAL CONDITION.   Hobert Poplaski M.D on 04/09/2015 at 10:19 AM  Between 7am to 6pm - Pager - [phone] After 6pm go to [email or web address] - password [email or web address] [facility]  Office  [phone]  CC: Primary care physician; Pcp Not In System

## 2015-04-10 ENCOUNTER — Other Ambulatory Visit: Payer: Self-pay | Admitting: Hematology and Oncology

## 2015-04-10 DIAGNOSIS — D693 Immune thrombocytopenic purpura: Secondary | ICD-10-CM

## 2015-04-10 LAB — CBC WITH DIFFERENTIAL/PLATELET
Basophils Absolute: 0 10*3/uL (ref 0–0.1)
Basophils Relative: 0 %
Eosinophils Absolute: 0 10*3/uL (ref 0–0.7)
Eosinophils Relative: 0 %
HCT: 33.1 % — ABNORMAL LOW (ref 35.0–47.0)
Hemoglobin: 10.7 g/dL — ABNORMAL LOW (ref 12.0–16.0)
Lymphocytes Relative: 10 %
Lymphs Abs: 1.2 10*3/uL (ref 1.0–3.6)
MCH: 28.1 pg (ref 26.0–34.0)
MCHC: 32.4 g/dL (ref 32.0–36.0)
MCV: 86.7 fL (ref 80.0–100.0)
Monocytes Absolute: 0.7 10*3/uL (ref 0.2–0.9)
Monocytes Relative: 6 %
Neutro Abs: 10.6 10*3/uL — ABNORMAL HIGH (ref 1.4–6.5)
Neutrophils Relative %: 84 %
Platelets: 26 10*3/uL — CL (ref 150–440)
RBC: 3.82 MIL/uL (ref 3.80–5.20)
RDW: 14.5 % (ref 11.5–14.5)
WBC: 12.6 10*3/uL — ABNORMAL HIGH (ref 3.6–11.0)

## 2015-04-10 LAB — CULTURE, GROUP A STREP (THRC)

## 2015-04-10 MED ORDER — BOOST / RESOURCE BREEZE PO LIQD: 1.0000 | Freq: Two times a day (BID) | ORAL | Status: DC

## 2015-04-10 MED ORDER — PREDNISONE 10 MG PO TABS: 10.0000 mg | ORAL_TABLET | Freq: Every day | ORAL | Status: DC

## 2015-04-10 NOTE — Progress Notes (Signed)
Patient discharged per MD order. Prescription given. All discharge instructions given and all questions answered.

## 2015-04-10 NOTE — Plan of Care (Signed)
Problem: Discharge Progression Outcomes Goal: Other Discharge Outcomes/Goals Plan of care progress to goal: - No complaints of pain. - Ambulates in room. - Small amount of bright red blood in stool this shift. Will continue to monitor.

## 2015-04-10 NOTE — Discharge Summary (Signed)
[place] at [place] NAME: Dana [name]    MR#:  [id number]  DATE OF BIRTH:  27-Jan-1961  DATE OF ADMISSION:  04/07/2015 ADMITTING PHYSICIAN: Idelle Crouch, MD  DATE OF DISCHARGE: 04/10/2015  PRIMARY CARE PHYSICIAN: Pcp Not In System    ADMISSION DIAGNOSIS:  Splenomegaly [R16.1] Severe thrombocytopenia [D69.6]  DISCHARGE DIAGNOSIS:  Principal Problem:   Thrombocytopenia Active Problems:   Fever   SECONDARY DIAGNOSIS:   Past Medical History  Diagnosis Date  . Lupus   . Uterine fibroid   . Hemorrhoid     HOSPITAL COURSE:  54 year old female with a history of lupus who presented to urgent care with fever and subsequently noted to have thrombocytopenia.  1. Thrombocytopenia: This is due to idiopathic thrombocytopenia. Abdominal ultrasound shows normal spleen. Platelet count has improved with IV steroids. Patient will be discharged with steroids taper as advised by oncology. Patient will follow up with oncology at the end of the week.   2. LUPUS: she is not on medications at this time. Patient will follow-up with her rheumatologist in [place]. 3. Face rash: Resolved  DISCHARGE CONDITIONS AND DIET:  Patient's being discharged home in stable condition on a regular diet  CONSULTS OBTAINED:  Treatment Team:  Lequita Asal, MD  DRUG ALLERGIES:   Allergies  Allergen Reactions  . Aspirin     Stomach issue  . Latex Swelling    Patient said she can use latex gloves now.    DISCHARGE MEDICATIONS:   Current Discharge Medication List    START taking these medications   Details  feeding supplement (BOOST / RESOURCE BREEZE) LIQD Take 1 Container by mouth 2 (two) times daily between meals. Qty: 1 Container, Refills: 0    predniSONE (DELTASONE) 10 MG tablet Take 1 tablet (10 mg total) by mouth daily with breakfast. Qty: 40 tablet, Refills: 0      CONTINUE these medications which have NOT CHANGED   Details   acetaminophen (TYLENOL) 500 MG tablet Take 500 mg by mouth every 6 (six) hours as needed.    calcium-vitamin D (OSCAL WITH D) 500-200 MG-UNIT per tablet Take 1 tablet by mouth.    Multiple Vitamin (MULTIVITAMIN) capsule Take 1 capsule by mouth daily.      STOP taking these medications     doxycycline (ADOXA) 100 MG tablet               Today   CHIEF COMPLAINT:  Patient is doing well this morning. No acute events overnight.   VITAL SIGNS:  Blood pressure 131/69, pulse 57, temperature 97.5 F (36.4 C), temperature source Oral, resp. rate 18, height 5\' 8"  (1.727 m), weight 60.646 kg (133 lb 11.2 oz), SpO2 99 %.   REVIEW OF SYSTEMS:  Review of Systems  Constitutional: Negative for fever, chills and malaise/fatigue.  HENT: Negative for sore throat.   Eyes: Negative for blurred vision.  Respiratory: Negative for cough, hemoptysis, shortness of breath and wheezing.   Cardiovascular: Negative for chest pain, palpitations and leg swelling.  Gastrointestinal: Negative for nausea, vomiting, abdominal pain, diarrhea and blood in stool.  Genitourinary: Negative for dysuria.  Musculoskeletal: Negative for back pain.  Neurological: Negative for dizziness, tremors and headaches.  Endo/Heme/Allergies: Does not bruise/bleed easily.     PHYSICAL EXAMINATION:  GENERAL:  54 y.o.-year-old patient lying in the bed with no acute distress.  NECK:  Supple, no jugular venous distention. No thyroid enlargement, no tenderness.  LUNGS: Normal breath  sounds bilaterally, no wheezing, rales,rhonchi  No use of accessory muscles of respiration.  CARDIOVASCULAR: S1, S2 normal. No murmurs, rubs, or gallops.  ABDOMEN: Soft, non-tender, non-distended. Bowel sounds present. No organomegaly or mass.  EXTREMITIES: No pedal edema, cyanosis, or clubbing.  PSYCHIATRIC: The patient is alert and oriented x 3.  SKIN: No obvious rash, lesion, or ulcer.   DATA REVIEW:   CBC  Recent Labs Lab  04/10/15 0415  WBC 12.6*  HGB 10.7*  HCT 33.1*  PLT 26*    Chemistries   Recent Labs Lab 04/08/15 0416  NA 137  K 4.5  CL 104  CO2 25  GLUCOSE 143*  BUN 9  CREATININE 0.73  CALCIUM 8.5*  AST 29  ALT 19  ALKPHOS 50  BILITOT 0.9    Cardiac Enzymes No results for input(s): TROPONINI in the last 168 hours.  Microbiology Results  @[email or web address] @  RADIOLOGY:  No results found.    Management plans discussed with the patient and she is in agreement. Stable for discharge home  Patient should follow up with ONCOLOGY FRIDAY  CODE STATUS:     Code Status Orders        Start     Ordered   04/07/15 2222  Full code   Continuous     04/07/15 2221    Advance Directive Documentation        Most Recent Value   Type of Advance Directive  Healthcare Power of [place], Living will   Pre-existing out of facility DNR order (yellow form or pink MOST form)     "MOST" Form in Place?        TOTAL TIME TAKING CARE OF THIS PATIENT: 35 minutes.    Karely Hurtado M.D on 04/10/2015 at 11:01 AM  Between 7am to 6pm - Pager - [phone] After 6pm go to [email or web address] - password [email or web address] [facility]  Office  [phone]  CC: Primary care physician; Pcp Not In System

## 2015-04-11 ENCOUNTER — Other Ambulatory Visit: Payer: 59

## 2015-04-11 ENCOUNTER — Inpatient Hospital Stay: Payer: 59 | Attending: Hematology and Oncology

## 2015-04-11 DIAGNOSIS — L930 Discoid lupus erythematosus: Secondary | ICD-10-CM | POA: Insufficient documentation

## 2015-04-11 DIAGNOSIS — Z79899 Other long term (current) drug therapy: Secondary | ICD-10-CM | POA: Insufficient documentation

## 2015-04-11 DIAGNOSIS — D696 Thrombocytopenia, unspecified: Secondary | ICD-10-CM | POA: Diagnosis present

## 2015-04-11 DIAGNOSIS — D693 Immune thrombocytopenic purpura: Secondary | ICD-10-CM

## 2015-04-11 LAB — CBC WITH DIFFERENTIAL/PLATELET
Basophils Absolute: 0.1 10*3/uL (ref 0–0.1)
Basophils Relative: 0 %
Eosinophils Absolute: 0 10*3/uL (ref 0–0.7)
Eosinophils Relative: 0 %
HCT: 35.7 % (ref 35.0–47.0)
Hemoglobin: 11.5 g/dL — ABNORMAL LOW (ref 12.0–16.0)
Lymphocytes Relative: 19 %
Lymphs Abs: 2.8 10*3/uL (ref 1.0–3.6)
MCH: 28.1 pg (ref 26.0–34.0)
MCHC: 32.2 g/dL (ref 32.0–36.0)
MCV: 87.1 fL (ref 80.0–100.0)
Monocytes Absolute: 0.8 10*3/uL (ref 0.2–0.9)
Monocytes Relative: 5 %
Neutro Abs: 11.5 10*3/uL — ABNORMAL HIGH (ref 1.4–6.5)
Neutrophils Relative %: 76 %
Platelets: 65 10*3/uL — ABNORMAL LOW (ref 150–440)
RBC: 4.1 MIL/uL (ref 3.80–5.20)
RDW: 15.2 % — ABNORMAL HIGH (ref 11.5–14.5)
WBC: 15.2 10*3/uL — ABNORMAL HIGH (ref 3.6–11.0)

## 2015-04-11 LAB — BASIC METABOLIC PANEL
Anion gap: 8 (ref 5–15)
BUN: 24 mg/dL — ABNORMAL HIGH (ref 6–20)
CO2: 25 mmol/L (ref 22–32)
Calcium: 9.1 mg/dL (ref 8.9–10.3)
Chloride: 103 mmol/L (ref 101–111)
Creatinine, Ser: 0.79 mg/dL (ref 0.44–1.00)
GFR calc Af Amer: >60 mL/min (ref >60)
GFR calc non Af Amer: >60 mL/min (ref >60)
Glucose, Bld: 128 mg/dL — ABNORMAL HIGH (ref 65–99)
Potassium: 4.4 mmol/L (ref 3.5–5.1)
Sodium: 136 mmol/L (ref 135–145)

## 2015-04-12 LAB — CULTURE, BLOOD (ROUTINE X 2)
CULTURE: NO GROWTH
CULTURE: NO GROWTH

## 2015-04-13 ENCOUNTER — Inpatient Hospital Stay (HOSPITAL_BASED_OUTPATIENT_CLINIC_OR_DEPARTMENT_OTHER): Payer: 59 | Admitting: Hematology and Oncology

## 2015-04-13 ENCOUNTER — Encounter: Payer: Self-pay | Admitting: Hematology and Oncology

## 2015-04-13 ENCOUNTER — Other Ambulatory Visit: Payer: Self-pay

## 2015-04-13 ENCOUNTER — Inpatient Hospital Stay: Payer: 59

## 2015-04-13 VITALS — BP 126/82 | HR 75 | Temp 95.9°F | Resp 17 | Ht 68.0 in | Wt 124.1 lb

## 2015-04-13 DIAGNOSIS — Z79899 Other long term (current) drug therapy: Secondary | ICD-10-CM

## 2015-04-13 DIAGNOSIS — D693 Immune thrombocytopenic purpura: Secondary | ICD-10-CM

## 2015-04-13 DIAGNOSIS — D696 Thrombocytopenia, unspecified: Secondary | ICD-10-CM | POA: Diagnosis not present

## 2015-04-13 DIAGNOSIS — L930 Discoid lupus erythematosus: Secondary | ICD-10-CM

## 2015-04-13 LAB — BASIC METABOLIC PANEL
Anion gap: 7 (ref 5–15)
BUN: 24 mg/dL — ABNORMAL HIGH (ref 6–20)
CO2: 24 mmol/L (ref 22–32)
Calcium: 9.1 mg/dL (ref 8.9–10.3)
Chloride: 106 mmol/L (ref 101–111)
Creatinine, Ser: 0.8 mg/dL (ref 0.44–1.00)
GFR calc Af Amer: >60 mL/min (ref >60)
GFR calc non Af Amer: >60 mL/min (ref >60)
Glucose, Bld: 109 mg/dL — ABNORMAL HIGH (ref 65–99)
Potassium: 4.1 mmol/L (ref 3.5–5.1)
Sodium: 137 mmol/L (ref 135–145)

## 2015-04-13 LAB — H PYLORI, IGM, IGG, IGA AB
H Pylori IgG: <0.9 U/mL (ref 0.0–0.8)
H. Pylogi, Iga Abs: <9 units (ref 0.0–8.9)
H. Pylogi, Igm Abs: <9 units (ref 0.0–8.9)

## 2015-04-13 LAB — PTT-LA MIX: PTT-LA MIX: 37.1 s (ref 0.0–50.0)

## 2015-04-13 LAB — CBC WITH DIFFERENTIAL/PLATELET
Basophils Absolute: 0 10*3/uL (ref 0–0.1)
Basophils Relative: 0 %
Eosinophils Absolute: 0 10*3/uL (ref 0–0.7)
Eosinophils Relative: 0 %
HCT: 34.8 % — ABNORMAL LOW (ref 35.0–47.0)
Hemoglobin: 11.3 g/dL — ABNORMAL LOW (ref 12.0–16.0)
Lymphocytes Relative: 23 %
Lymphs Abs: 3.1 10*3/uL (ref 1.0–3.6)
MCH: 28.5 pg (ref 26.0–34.0)
MCHC: 32.5 g/dL (ref 32.0–36.0)
MCV: 87.7 fL (ref 80.0–100.0)
Monocytes Absolute: 0.6 10*3/uL (ref 0.2–0.9)
Monocytes Relative: 5 %
Neutro Abs: 10 10*3/uL — ABNORMAL HIGH (ref 1.4–6.5)
Neutrophils Relative %: 72 %
Platelets: 143 10*3/uL — ABNORMAL LOW (ref 150–440)
RBC: 3.97 MIL/uL (ref 3.80–5.20)
RDW: 15.1 % — ABNORMAL HIGH (ref 11.5–14.5)
WBC: 13.8 10*3/uL — ABNORMAL HIGH (ref 3.6–11.0)

## 2015-04-13 LAB — HEPATITIS B SURFACE ANTIBODY,QUALITATIVE: Hep B S Ab: REACTIVE

## 2015-04-13 LAB — HIV ANTIBODY (ROUTINE TESTING W REFLEX): HIV Screen 4th Generation wRfx: NONREACTIVE

## 2015-04-13 LAB — IGG, IGA, IGM
IgA: 156 mg/dL (ref 87–352)
IgG (Immunoglobin G), Serum: 2894 mg/dL — ABNORMAL HIGH (ref 700–1600)
IgM, Serum: 123 mg/dL (ref 26–217)

## 2015-04-13 LAB — LUPUS ANTICOAGULANT PANEL
DRVVT: 34.5 s (ref 0.0–55.1)
PTT Lupus Anticoagulant: 174 s — ABNORMAL HIGH (ref 0.0–50.0)

## 2015-04-13 LAB — PTT-LA INCUB MIX: PTT-LA INCUB MIX: 41.3 s (ref 0.0–50.0)

## 2015-04-13 LAB — HEPATITIS B CORE ANTIBODY, TOTAL: Hep B Core Total Ab: NEGATIVE

## 2015-04-13 LAB — HEPATITIS C ANTIBODY: HCV Ab: <0.1 s/co ratio (ref 0.0–0.9)

## 2015-04-13 LAB — MAGNESIUM: Magnesium: 2.2 mg/dL (ref 1.7–2.4)

## 2015-04-13 MED ORDER — PREDNISONE 20 MG PO TABS: 60.0000 mg | ORAL_TABLET | Freq: Every day | ORAL | Status: DC

## 2015-04-13 MED ORDER — OMEPRAZOLE 20 MG PO CPDR: 20.0000 mg | DELAYED_RELEASE_CAPSULE | Freq: Every day | ORAL | Status: DC

## 2015-04-13 NOTE — Progress Notes (Signed)
Pt reports no major differences since last visit.  Still having cramps in legs at nighttime
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
## 2015-04-16 ENCOUNTER — Ambulatory Visit: Admission: RE | Admit: 2015-04-16 | Payer: 59 | Source: Ambulatory Visit | Admitting: Gastroenterology

## 2015-04-16 ENCOUNTER — Inpatient Hospital Stay: Payer: 59

## 2015-04-16 ENCOUNTER — Telehealth: Payer: Self-pay

## 2015-04-16 ENCOUNTER — Encounter: Admission: RE | Payer: Self-pay | Source: Ambulatory Visit

## 2015-04-16 DIAGNOSIS — D696 Thrombocytopenia, unspecified: Secondary | ICD-10-CM | POA: Diagnosis not present

## 2015-04-16 LAB — CBC WITH DIFFERENTIAL/PLATELET
Basophils Absolute: 0 10*3/uL (ref 0–0.1)
Basophils Relative: 0 %
Eosinophils Absolute: 0 10*3/uL (ref 0–0.7)
Eosinophils Relative: 0 %
HCT: 33.8 % — ABNORMAL LOW (ref 35.0–47.0)
Hemoglobin: 11 g/dL — ABNORMAL LOW (ref 12.0–16.0)
Lymphocytes Relative: 18 %
Lymphs Abs: 2.5 10*3/uL (ref 1.0–3.6)
MCH: 28.9 pg (ref 26.0–34.0)
MCHC: 32.5 g/dL (ref 32.0–36.0)
MCV: 89.1 fL (ref 80.0–100.0)
Monocytes Absolute: 1.3 10*3/uL — ABNORMAL HIGH (ref 0.2–0.9)
Monocytes Relative: 9 %
Neutro Abs: 10.1 10*3/uL — ABNORMAL HIGH (ref 1.4–6.5)
Neutrophils Relative %: 73 %
Platelets: 196 10*3/uL (ref 150–440)
RBC: 3.8 MIL/uL (ref 3.80–5.20)
RDW: 16.2 % — ABNORMAL HIGH (ref 11.5–14.5)
WBC: 13.9 10*3/uL — ABNORMAL HIGH (ref 3.6–11.0)

## 2015-04-16 SURGERY — COLONOSCOPY WITH PROPOFOL
Anesthesia: General

## 2015-04-16 NOTE — Telephone Encounter (Signed)
Called and spoke with pt.  Per Dr. Mike Gip decrease prednisone to 50 mg a day. Pt verbalized an understanding and stated she would she [place] on 9/7

## 2015-04-17 ENCOUNTER — Inpatient Hospital Stay: Payer: 59 | Admitting: Hematology and Oncology

## 2015-04-24 ENCOUNTER — Other Ambulatory Visit: Payer: 59

## 2015-04-24 ENCOUNTER — Ambulatory Visit: Payer: 59 | Admitting: Hematology and Oncology

## 2015-04-25 ENCOUNTER — Inpatient Hospital Stay (HOSPITAL_BASED_OUTPATIENT_CLINIC_OR_DEPARTMENT_OTHER): Payer: 59 | Admitting: Hematology and Oncology

## 2015-04-25 ENCOUNTER — Inpatient Hospital Stay: Payer: 59 | Attending: Hematology and Oncology

## 2015-04-25 DIAGNOSIS — D693 Immune thrombocytopenic purpura: Secondary | ICD-10-CM | POA: Diagnosis not present

## 2015-04-25 DIAGNOSIS — Z79899 Other long term (current) drug therapy: Secondary | ICD-10-CM

## 2015-04-25 DIAGNOSIS — M329 Systemic lupus erythematosus, unspecified: Secondary | ICD-10-CM | POA: Insufficient documentation

## 2015-04-25 DIAGNOSIS — D696 Thrombocytopenia, unspecified: Secondary | ICD-10-CM | POA: Diagnosis present

## 2015-04-25 LAB — CBC WITH DIFFERENTIAL/PLATELET
Basophils Absolute: 0 10*3/uL (ref 0–0.1)
Basophils Relative: 0 %
Eosinophils Absolute: 0 10*3/uL (ref 0–0.7)
Eosinophils Relative: 0 %
HCT: 37.3 % (ref 35.0–47.0)
Hemoglobin: 11.9 g/dL — ABNORMAL LOW (ref 12.0–16.0)
Lymphocytes Relative: 21 %
Lymphs Abs: 2.4 10*3/uL (ref 1.0–3.6)
MCH: 28.9 pg (ref 26.0–34.0)
MCHC: 32 g/dL (ref 32.0–36.0)
MCV: 90.3 fL (ref 80.0–100.0)
Monocytes Absolute: 0.6 10*3/uL (ref 0.2–0.9)
Monocytes Relative: 5 %
Neutro Abs: 8.5 10*3/uL — ABNORMAL HIGH (ref 1.4–6.5)
Neutrophils Relative %: 74 %
Platelets: 110 10*3/uL — ABNORMAL LOW (ref 150–440)
RBC: 4.13 MIL/uL (ref 3.80–5.20)
RDW: 18.6 % — ABNORMAL HIGH (ref 11.5–14.5)
WBC: 11.5 10*3/uL — ABNORMAL HIGH (ref 3.6–11.0)

## 2015-04-25 NOTE — Progress Notes (Signed)
F/U visit for thrombocytopenia. Pt regaining some energy. Fatigued by lunchtime every day. Has morning hoarseness. Leg cramps nearly gone. No bruising. Denies bleeding gums. No blood in urine or stool. Has not started taking omeprazole. Eating better. Still taking 1 boost daily in creal.
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
## 2015-04-30 ENCOUNTER — Encounter: Payer: Self-pay | Admitting: Hematology and Oncology

## 2015-05-02 ENCOUNTER — Inpatient Hospital Stay (HOSPITAL_BASED_OUTPATIENT_CLINIC_OR_DEPARTMENT_OTHER): Payer: 59 | Admitting: Hematology and Oncology

## 2015-05-02 ENCOUNTER — Inpatient Hospital Stay: Payer: 59

## 2015-05-02 VITALS — BP 161/76 | HR 72 | Temp 95.9°F | Resp 18 | Ht 67.75 in | Wt 126.1 lb

## 2015-05-02 DIAGNOSIS — D693 Immune thrombocytopenic purpura: Secondary | ICD-10-CM

## 2015-05-02 DIAGNOSIS — Z79899 Other long term (current) drug therapy: Secondary | ICD-10-CM | POA: Diagnosis not present

## 2015-05-02 DIAGNOSIS — M329 Systemic lupus erythematosus, unspecified: Secondary | ICD-10-CM | POA: Diagnosis not present

## 2015-05-02 LAB — BASIC METABOLIC PANEL
Anion gap: 9 (ref 5–15)
BUN: 23 mg/dL — ABNORMAL HIGH (ref 6–20)
CO2: 30 mmol/L (ref 22–32)
Calcium: 9.3 mg/dL (ref 8.9–10.3)
Chloride: 100 mmol/L — ABNORMAL LOW (ref 101–111)
Creatinine, Ser: 0.77 mg/dL (ref 0.44–1.00)
GFR calc Af Amer: >60 mL/min (ref >60)
GFR calc non Af Amer: >60 mL/min (ref >60)
Glucose, Bld: 124 mg/dL — ABNORMAL HIGH (ref 65–99)
Potassium: 4.3 mmol/L (ref 3.5–5.1)
Sodium: 139 mmol/L (ref 135–145)

## 2015-05-02 LAB — CBC WITH DIFFERENTIAL/PLATELET
Basophils Absolute: 0.1 10*3/uL (ref 0–0.1)
Basophils Relative: 1 %
Eosinophils Absolute: 0 10*3/uL (ref 0–0.7)
Eosinophils Relative: 0 %
HCT: 40.2 % (ref 35.0–47.0)
Hemoglobin: 13 g/dL (ref 12.0–16.0)
Lymphocytes Relative: 26 %
Lymphs Abs: 2.6 10*3/uL (ref 1.0–3.6)
MCH: 29.4 pg (ref 26.0–34.0)
MCHC: 32.4 g/dL (ref 32.0–36.0)
MCV: 90.7 fL (ref 80.0–100.0)
Monocytes Absolute: 0.5 10*3/uL (ref 0.2–0.9)
Monocytes Relative: 5 %
Neutro Abs: 6.8 10*3/uL — ABNORMAL HIGH (ref 1.4–6.5)
Neutrophils Relative %: 68 %
Platelets: 250 10*3/uL (ref 150–440)
RBC: 4.44 MIL/uL (ref 3.80–5.20)
RDW: 18.5 % — ABNORMAL HIGH (ref 11.5–14.5)
WBC: 9.9 10*3/uL (ref 3.6–11.0)

## 2015-05-02 LAB — URIC ACID: Uric Acid, Serum: 3.7 mg/dL (ref 2.3–6.6)

## 2015-05-02 LAB — LACTATE DEHYDROGENASE: LDH: 127 U/L (ref 98–192)

## 2015-05-02 NOTE — Progress Notes (Signed)
Pt reports having some hoarsness in the mornings that she noticed when she began taking the steroids.  Pt reports having gained a couple of pounds and her night sweats have improved since last visit.
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
## 2015-05-03 LAB — PROTEIN ELECTROPHORESIS, SERUM
A/G Ratio: 0.9 (ref 0.7–1.7)
Albumin ELP: 3.4 g/dL (ref 2.9–4.4)
Alpha-1-Globulin: 0.2 g/dL (ref 0.0–0.4)
Alpha-2-Globulin: 0.9 g/dL (ref 0.4–1.0)
Beta Globulin: 0.9 g/dL (ref 0.7–1.3)
Gamma Globulin: 2 g/dL — ABNORMAL HIGH (ref 0.4–1.8)
Globulin, Total: 3.9 g/dL (ref 2.2–3.9)
Total Protein ELP: 7.3 g/dL (ref 6.0–8.5)

## 2015-05-03 LAB — HEPATITIS B SURFACE ANTIGEN: Hepatitis B Surface Ag: NEGATIVE

## 2015-05-09 ENCOUNTER — Inpatient Hospital Stay: Payer: 59

## 2015-05-09 ENCOUNTER — Encounter: Payer: Self-pay | Admitting: *Deleted

## 2015-05-09 DIAGNOSIS — D696 Thrombocytopenia, unspecified: Secondary | ICD-10-CM

## 2015-05-09 DIAGNOSIS — D693 Immune thrombocytopenic purpura: Secondary | ICD-10-CM | POA: Diagnosis not present

## 2015-05-09 LAB — CBC WITH DIFFERENTIAL/PLATELET
Basophils Absolute: 0 10*3/uL (ref 0–0.1)
Basophils Relative: 1 %
Eosinophils Absolute: 0 10*3/uL (ref 0–0.7)
Eosinophils Relative: 0 %
HCT: 41 % (ref 35.0–47.0)
Hemoglobin: 13.4 g/dL (ref 12.0–16.0)
Lymphocytes Relative: 20 %
Lymphs Abs: 1.9 10*3/uL (ref 1.0–3.6)
MCH: 29.7 pg (ref 26.0–34.0)
MCHC: 32.7 g/dL (ref 32.0–36.0)
MCV: 90.7 fL (ref 80.0–100.0)
Monocytes Absolute: 0.6 10*3/uL (ref 0.2–0.9)
Monocytes Relative: 7 %
Neutro Abs: 6.8 10*3/uL — ABNORMAL HIGH (ref 1.4–6.5)
Neutrophils Relative %: 72 %
Platelets: 230 10*3/uL (ref 150–440)
RBC: 4.51 MIL/uL (ref 3.80–5.20)
RDW: 18.2 % — ABNORMAL HIGH (ref 11.5–14.5)
WBC: 9.4 10*3/uL (ref 3.6–11.0)

## 2015-05-09 NOTE — Progress Notes (Signed)
Pt here in clinic for lab only appt. Platelets are 230 today. Spoke with Dr Mike Gip and she wanted patient to decrease prednisone to 20mg  daily. Gave patient a copy of todays lab results and instructions to decrease prednisone to 20mg  daily. Asked pt if she needed a refill on prednisone she replied that" no she has plenty left."

## 2015-05-16 ENCOUNTER — Ambulatory Visit (HOSPITAL_BASED_OUTPATIENT_CLINIC_OR_DEPARTMENT_OTHER): Payer: 59 | Admitting: Hematology and Oncology

## 2015-05-16 ENCOUNTER — Inpatient Hospital Stay: Payer: 59

## 2015-05-16 VITALS — BP 169/77 | HR 70 | Temp 97.4°F | Resp 17 | Ht 67.0 in | Wt 128.2 lb

## 2015-05-16 DIAGNOSIS — D696 Thrombocytopenia, unspecified: Secondary | ICD-10-CM

## 2015-05-16 DIAGNOSIS — Z79899 Other long term (current) drug therapy: Secondary | ICD-10-CM

## 2015-05-16 DIAGNOSIS — M329 Systemic lupus erythematosus, unspecified: Secondary | ICD-10-CM | POA: Diagnosis not present

## 2015-05-16 DIAGNOSIS — D693 Immune thrombocytopenic purpura: Secondary | ICD-10-CM

## 2015-05-16 LAB — CBC WITH DIFFERENTIAL/PLATELET
Basophils Absolute: 0.1 10*3/uL (ref 0–0.1)
Basophils Relative: 1 %
Eosinophils Absolute: 0 10*3/uL (ref 0–0.7)
Eosinophils Relative: 0 %
HCT: 41.2 % (ref 35.0–47.0)
Hemoglobin: 13.6 g/dL (ref 12.0–16.0)
Lymphocytes Relative: 21 %
Lymphs Abs: 2.1 10*3/uL (ref 1.0–3.6)
MCH: 30 pg (ref 26.0–34.0)
MCHC: 32.9 g/dL (ref 32.0–36.0)
MCV: 91.2 fL (ref 80.0–100.0)
Monocytes Absolute: 0.6 10*3/uL (ref 0.2–0.9)
Monocytes Relative: 6 %
Neutro Abs: 7.1 10*3/uL — ABNORMAL HIGH (ref 1.4–6.5)
Neutrophils Relative %: 72 %
Platelets: 209 10*3/uL (ref 150–440)
RBC: 4.51 MIL/uL (ref 3.80–5.20)
RDW: 17.8 % — ABNORMAL HIGH (ref 11.5–14.5)
WBC: 10 10*3/uL (ref 3.6–11.0)

## 2015-05-16 LAB — STOOL CULTURE: Special Requests: NORMAL

## 2015-05-16 NOTE — Progress Notes (Signed)
Follow up ITP. Night sweats continue to improve.  Energy level has increased.  Report some stress related to mother being sick and not sure see will make in time to see her.  Reports still taking 20mg  prednisone.

## 2015-05-23 ENCOUNTER — Inpatient Hospital Stay: Payer: 59

## 2015-05-30 ENCOUNTER — Inpatient Hospital Stay: Payer: 59 | Attending: Hematology and Oncology

## 2015-05-30 DIAGNOSIS — Z79899 Other long term (current) drug therapy: Secondary | ICD-10-CM | POA: Diagnosis not present

## 2015-05-30 DIAGNOSIS — M329 Systemic lupus erythematosus, unspecified: Secondary | ICD-10-CM | POA: Insufficient documentation

## 2015-05-30 DIAGNOSIS — D693 Immune thrombocytopenic purpura: Secondary | ICD-10-CM | POA: Diagnosis not present

## 2015-05-30 DIAGNOSIS — D696 Thrombocytopenia, unspecified: Secondary | ICD-10-CM

## 2015-05-30 LAB — CBC WITH DIFFERENTIAL/PLATELET
Basophils Absolute: 0 10*3/uL (ref 0–0.1)
Basophils Relative: 0 %
Eosinophils Absolute: 0 10*3/uL (ref 0–0.7)
Eosinophils Relative: 1 %
HCT: 41.8 % (ref 35.0–47.0)
Hemoglobin: 13.6 g/dL (ref 12.0–16.0)
Lymphocytes Relative: 25 %
Lymphs Abs: 1.7 10*3/uL (ref 1.0–3.6)
MCH: 29.6 pg (ref 26.0–34.0)
MCHC: 32.6 g/dL (ref 32.0–36.0)
MCV: 90.8 fL (ref 80.0–100.0)
Monocytes Absolute: 0.6 10*3/uL (ref 0.2–0.9)
Monocytes Relative: 8 %
Neutro Abs: 4.4 10*3/uL (ref 1.4–6.5)
Neutrophils Relative %: 66 %
Platelets: 231 10*3/uL (ref 150–440)
RBC: 4.6 MIL/uL (ref 3.80–5.20)
RDW: 16.5 % — ABNORMAL HIGH (ref 11.5–14.5)
WBC: 6.8 10*3/uL (ref 3.6–11.0)

## 2015-06-06 ENCOUNTER — Ambulatory Visit: Payer: 59 | Admitting: Hematology and Oncology

## 2015-06-06 ENCOUNTER — Telehealth: Payer: Self-pay

## 2015-06-06 ENCOUNTER — Inpatient Hospital Stay: Payer: 59

## 2015-06-06 DIAGNOSIS — D693 Immune thrombocytopenic purpura: Secondary | ICD-10-CM | POA: Diagnosis not present

## 2015-06-06 DIAGNOSIS — D696 Thrombocytopenia, unspecified: Secondary | ICD-10-CM

## 2015-06-06 LAB — CBC WITH DIFFERENTIAL/PLATELET
Basophils Absolute: 0 10*3/uL (ref 0–0.1)
Basophils Relative: 1 %
Eosinophils Absolute: 0 10*3/uL (ref 0–0.7)
Eosinophils Relative: 1 %
HCT: 42.4 % (ref 35.0–47.0)
Hemoglobin: 13.7 g/dL (ref 12.0–16.0)
Lymphocytes Relative: 15 %
Lymphs Abs: 1 10*3/uL (ref 1.0–3.6)
MCH: 29.4 pg (ref 26.0–34.0)
MCHC: 32.3 g/dL (ref 32.0–36.0)
MCV: 91.1 fL (ref 80.0–100.0)
Monocytes Absolute: 0.4 10*3/uL (ref 0.2–0.9)
Monocytes Relative: 7 %
Neutro Abs: 5 10*3/uL (ref 1.4–6.5)
Neutrophils Relative %: 76 %
Platelets: 171 10*3/uL (ref 150–440)
RBC: 4.65 MIL/uL (ref 3.80–5.20)
RDW: 16.8 % — ABNORMAL HIGH (ref 11.5–14.5)
WBC: 6.5 10*3/uL (ref 3.6–11.0)

## 2015-06-06 NOTE — Telephone Encounter (Signed)
Per MD pt was to decrease from 10mg  to 5mg  of Prednisone on 05/30/2015.  Today 06/06/2015 pt is to start taking her prednisone 5mg  every other day.  Pt aware and verbalizes an understanding of new dosage.

## 2015-06-13 ENCOUNTER — Inpatient Hospital Stay (HOSPITAL_BASED_OUTPATIENT_CLINIC_OR_DEPARTMENT_OTHER): Payer: 59 | Admitting: Hematology and Oncology

## 2015-06-13 ENCOUNTER — Inpatient Hospital Stay: Payer: 59

## 2015-06-13 VITALS — BP 149/74 | HR 88 | Temp 97.0°F | Ht 67.0 in | Wt 135.4 lb

## 2015-06-13 DIAGNOSIS — D696 Thrombocytopenia, unspecified: Secondary | ICD-10-CM

## 2015-06-13 DIAGNOSIS — M329 Systemic lupus erythematosus, unspecified: Secondary | ICD-10-CM | POA: Diagnosis not present

## 2015-06-13 DIAGNOSIS — Z79899 Other long term (current) drug therapy: Secondary | ICD-10-CM

## 2015-06-13 DIAGNOSIS — D693 Immune thrombocytopenic purpura: Secondary | ICD-10-CM | POA: Diagnosis not present

## 2015-06-13 LAB — CBC WITH DIFFERENTIAL/PLATELET
Basophils Absolute: 0 10*3/uL (ref 0–0.1)
Basophils Relative: 1 %
Eosinophils Absolute: 0.1 10*3/uL (ref 0–0.7)
Eosinophils Relative: 1 %
HCT: 42.1 % (ref 35.0–47.0)
Hemoglobin: 13.7 g/dL (ref 12.0–16.0)
Lymphocytes Relative: 21 %
Lymphs Abs: 1.1 10*3/uL (ref 1.0–3.6)
MCH: 29.4 pg (ref 26.0–34.0)
MCHC: 32.5 g/dL (ref 32.0–36.0)
MCV: 90.4 fL (ref 80.0–100.0)
Monocytes Absolute: 0.4 10*3/uL (ref 0.2–0.9)
Monocytes Relative: 9 %
Neutro Abs: 3.5 10*3/uL (ref 1.4–6.5)
Neutrophils Relative %: 68 %
Platelets: 137 10*3/uL — ABNORMAL LOW (ref 150–440)
RBC: 4.65 MIL/uL (ref 3.80–5.20)
RDW: 16.4 % — ABNORMAL HIGH (ref 11.5–14.5)
WBC: 5.1 10*3/uL (ref 3.6–11.0)

## 2015-06-13 LAB — CORTISOL: Cortisol, Plasma: 6.8 ug/dL
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
## 2015-06-13 NOTE — Progress Notes (Signed)
Night sweats are gone now.

## 2015-06-14 ENCOUNTER — Telehealth: Payer: Self-pay

## 2015-06-14 NOTE — Telephone Encounter (Signed)
Called pt per MD to relay Level low. Continue prednisone 5 mg QOD.   Recheck cortisol and CBC in 1 week.    Ensure it is on a day she would normally take her steroids in the morning, but hold it that morning, draw labs that day, then take prednisone after test like she did this week. Since next Wednesday would be on off day, draw on Thursday.   Pt did not answer left a message for pt to please continue medication same way on voicemail per pts request she left on my voicmail to leave her a voicemail regarding medication.

## 2015-06-20 ENCOUNTER — Inpatient Hospital Stay: Payer: 59 | Attending: Hematology and Oncology

## 2015-06-20 DIAGNOSIS — Z79899 Other long term (current) drug therapy: Secondary | ICD-10-CM | POA: Diagnosis not present

## 2015-06-20 DIAGNOSIS — D696 Thrombocytopenia, unspecified: Secondary | ICD-10-CM | POA: Diagnosis present

## 2015-06-20 DIAGNOSIS — M329 Systemic lupus erythematosus, unspecified: Secondary | ICD-10-CM | POA: Diagnosis not present

## 2015-06-20 DIAGNOSIS — D693 Immune thrombocytopenic purpura: Secondary | ICD-10-CM

## 2015-06-20 LAB — CBC WITH DIFFERENTIAL/PLATELET
Basophils Absolute: 0 10*3/uL (ref 0–0.1)
Basophils Relative: 1 %
Eosinophils Absolute: 0.1 10*3/uL (ref 0–0.7)
Eosinophils Relative: 3 %
HCT: 40.2 % (ref 35.0–47.0)
Hemoglobin: 13.1 g/dL (ref 12.0–16.0)
Lymphocytes Relative: 17 %
Lymphs Abs: 0.9 10*3/uL — ABNORMAL LOW (ref 1.0–3.6)
MCH: 29.4 pg (ref 26.0–34.0)
MCHC: 32.6 g/dL (ref 32.0–36.0)
MCV: 90 fL (ref 80.0–100.0)
Monocytes Absolute: 0.4 10*3/uL (ref 0.2–0.9)
Monocytes Relative: 7 %
Neutro Abs: 4 10*3/uL (ref 1.4–6.5)
Neutrophils Relative %: 72 %
Platelets: 133 10*3/uL — ABNORMAL LOW (ref 150–440)
RBC: 4.46 MIL/uL (ref 3.80–5.20)
RDW: 15.9 % — ABNORMAL HIGH (ref 11.5–14.5)
WBC: 5.5 10*3/uL (ref 3.6–11.0)

## 2015-06-21 LAB — CORTISOL: Cortisol, Plasma: 9.1 ug/dL

## 2015-06-22 ENCOUNTER — Telehealth: Payer: Self-pay | Admitting: Hematology and Oncology

## 2015-06-22 NOTE — Telephone Encounter (Signed)
Re:  Platelet count and cortisol level  Platelet count was 133,000 and cortisol level was 9.1 on 06/20/2015.  Discussed plan to discontinue prednisone.  Follow-up appointment scheduled in 1 month.  Lequita Asal, MD

## 2015-07-11 ENCOUNTER — Ambulatory Visit: Payer: 59 | Admitting: Hematology and Oncology

## 2015-07-11 ENCOUNTER — Other Ambulatory Visit: Payer: 59

## 2015-07-25 ENCOUNTER — Telehealth: Payer: Self-pay

## 2015-07-25 ENCOUNTER — Ambulatory Visit (HOSPITAL_BASED_OUTPATIENT_CLINIC_OR_DEPARTMENT_OTHER): Payer: 59 | Admitting: Hematology and Oncology

## 2015-07-25 ENCOUNTER — Inpatient Hospital Stay: Payer: 59 | Attending: Hematology and Oncology

## 2015-07-25 VITALS — BP 138/85 | HR 87 | Temp 98.7°F | Resp 18 | Ht 67.0 in | Wt 138.4 lb

## 2015-07-25 DIAGNOSIS — Z79899 Other long term (current) drug therapy: Secondary | ICD-10-CM | POA: Insufficient documentation

## 2015-07-25 DIAGNOSIS — M329 Systemic lupus erythematosus, unspecified: Secondary | ICD-10-CM | POA: Insufficient documentation

## 2015-07-25 DIAGNOSIS — D693 Immune thrombocytopenic purpura: Secondary | ICD-10-CM | POA: Diagnosis not present

## 2015-07-25 DIAGNOSIS — D696 Thrombocytopenia, unspecified: Secondary | ICD-10-CM

## 2015-07-25 LAB — CBC WITH DIFFERENTIAL/PLATELET
Basophils Absolute: 0.1 10*3/uL (ref 0–0.1)
Basophils Relative: 1 %
Eosinophils Absolute: 0.2 10*3/uL (ref 0–0.7)
Eosinophils Relative: 4 %
HCT: 37.6 % (ref 35.0–47.0)
Hemoglobin: 12.4 g/dL (ref 12.0–16.0)
Lymphocytes Relative: 17 %
Lymphs Abs: 0.8 10*3/uL — ABNORMAL LOW (ref 1.0–3.6)
MCH: 29.1 pg (ref 26.0–34.0)
MCHC: 32.9 g/dL (ref 32.0–36.0)
MCV: 88.6 fL (ref 80.0–100.0)
Monocytes Absolute: 0.5 10*3/uL (ref 0.2–0.9)
Monocytes Relative: 10 %
Neutro Abs: 3.3 10*3/uL (ref 1.4–6.5)
Neutrophils Relative %: 68 %
Platelets: 5 10*3/uL — CL (ref 150–440)
RBC: 4.24 MIL/uL (ref 3.80–5.20)
RDW: 14.2 % (ref 11.5–14.5)
WBC: 4.8 10*3/uL (ref 3.6–11.0)

## 2015-07-25 LAB — APTT: aPTT: 154 seconds — ABNORMAL HIGH (ref 24–36)

## 2015-07-25 LAB — BASIC METABOLIC PANEL
Anion gap: 11 (ref 5–15)
BUN: 19 mg/dL (ref 6–20)
CO2: 29 mmol/L (ref 22–32)
Calcium: 9.1 mg/dL (ref 8.9–10.3)
Chloride: 98 mmol/L — ABNORMAL LOW (ref 101–111)
Creatinine, Ser: 0.8 mg/dL (ref 0.44–1.00)
GFR calc Af Amer: >60 mL/min (ref >60)
GFR calc non Af Amer: >60 mL/min (ref >60)
Glucose, Bld: 95 mg/dL (ref 65–99)
Potassium: 4.5 mmol/L (ref 3.5–5.1)
Sodium: 138 mmol/L (ref 135–145)

## 2015-07-25 LAB — PROTIME-INR
INR: 1 (ref 0.00–1.49)
Prothrombin Time: 13.4 seconds (ref 11.6–15.2)

## 2015-07-25 LAB — MAGNESIUM: Magnesium: 2 mg/dL (ref 1.7–2.4)

## 2015-07-25 MED ORDER — PREDNISONE 20 MG PO TABS: 60.0000 mg | ORAL_TABLET | Freq: Every day | ORAL | Status: DC

## 2015-07-25 NOTE — Patient Instructions (Signed)
Rituximab injection What is this medicine? RITUXIMAB (ri TUX i mab) is a monoclonal antibody. It is used commonly to treat non-Hodgkin lymphoma and other conditions. It is also used to treat rheumatoid arthritis (RA). In RA, this medicine slows the inflammatory process and help reduce joint pain and swelling. This medicine is often used with other cancer or arthritis medications. This medicine may be used for other purposes; ask your health care provider or pharmacist if you have questions. What should I tell my health care provider before I take this medicine? They need to know if you have any of these conditions: -blood disorders -heart disease -history of hepatitis B -infection (especially a virus infection such as chickenpox, cold sores, or herpes) -irregular heartbeat -kidney disease -lung or breathing disease, like asthma -lupus -an unusual or allergic reaction to rituximab, mouse proteins, other medicines, foods, dyes, or preservatives -pregnant or trying to get pregnant -breast-feeding How should I use this medicine? This medicine is for infusion into a vein. It is administered in a hospital or clinic by a specially trained health care professional. A special MedGuide will be given to you by the pharmacist with each prescription and refill. Be sure to read this information carefully each time. Talk to your pediatrician regarding the use of this medicine in children. This medicine is not approved for use in children. Overdosage: If you think you have taken too much of this medicine contact a poison control center or emergency room at once. NOTE: This medicine is only for you. Do not share this medicine with others. What if I miss a dose? It is important not to miss a dose. Call your doctor or health care professional if you are unable to keep an appointment. What may interact with this medicine? -cisplatin -medicines for blood pressure -some other medicines for  arthritis -vaccines This list may not describe all possible interactions. Give your health care provider a list of all the medicines, herbs, non-prescription drugs, or dietary supplements you use. Also tell them if you smoke, drink alcohol, or use illegal drugs. Some items may interact with your medicine. What should I watch for while using this medicine? Report any side effects that you notice during your treatment right away, such as changes in your breathing, fever, chills, dizziness or lightheadedness. These effects are more common with the first dose. Visit your prescriber or health care professional for checks on your progress. You will need to have regular blood work. Report any other side effects. The side effects of this medicine can continue after you finish your treatment. Continue your course of treatment even though you feel ill unless your doctor tells you to stop. Call your doctor or health care professional for advice if you get a fever, chills or sore throat, or other symptoms of a cold or flu. Do not treat yourself. This drug decreases your body's ability to fight infections. Try to avoid being around people who are sick. This medicine may increase your risk to bruise or bleed. Call your doctor or health care professional if you notice any unusual bleeding. Be careful brushing and flossing your teeth or using a toothpick because you may get an infection or bleed more easily. If you have any dental work done, tell your dentist you are receiving this medicine. Avoid taking products that contain aspirin, acetaminophen, ibuprofen, naproxen, or ketoprofen unless instructed by your doctor. These medicines may hide a fever. Do not become pregnant while taking this medicine. Women should inform their doctor if   they wish to become pregnant or think they might be pregnant. There is a potential for serious side effects to an unborn child. Talk to your health care professional or pharmacist for more  information. Do not breast-feed an infant while taking this medicine. What side effects may I notice from receiving this medicine? Side effects that you should report to your doctor or health care professional as soon as possible: -allergic reactions like skin rash, itching or hives, swelling of the face, lips, or tongue -low blood counts - this medicine may decrease the number of white blood cells, red blood cells and platelets. You may be at increased risk for infections and bleeding. -signs of infection - fever or chills, cough, sore throat, pain or difficulty passing urine -signs of decreased platelets or bleeding - bruising, pinpoint red spots on the skin, black, tarry stools, blood in the urine -signs of decreased red blood cells - unusually weak or tired, fainting spells, lightheadedness -breathing problems -confused, not responsive -chest pain -fast, irregular heartbeat -feeling faint or lightheaded, falls -mouth sores -redness, blistering, peeling or loosening of the skin, including inside the mouth -stomach pain -swelling of the ankles, feet, or hands -trouble passing urine or change in the amount of urine Side effects that usually do not require medical attention (report to your doctor or other health care professional if they continue or are bothersome): -anxiety -headache -loss of appetite -muscle aches -nausea -night sweats This list may not describe all possible side effects. Call your doctor for medical advice about side effects. You may report side effects to FDA at 1-800-FDA-1088. Where should I keep my medicine? This drug is given in a hospital or clinic and will not be stored at home. NOTE: This sheet is a summary. It may not cover all possible information. If you have questions about this medicine, talk to your doctor, pharmacist, or health care provider.    2016, [employer]vier/Gold Standard. (2014-10-11 22:30:56)  

## 2015-07-25 NOTE — Telephone Encounter (Signed)
Lab called [id number] to report platlet count of 5.  MD aware immediately.  Pt Being seen in clinic.

## 2015-07-25 NOTE — Progress Notes (Signed)
Patient is here for follow-up of ITP. She states that for the past couple of days she has had some leg cramps at night. Last night they seemed to be worse.

## 2015-07-26 ENCOUNTER — Encounter: Payer: Self-pay | Admitting: *Deleted

## 2015-07-26 ENCOUNTER — Encounter: Payer: Self-pay | Admitting: Hematology and Oncology

## 2015-07-26 ENCOUNTER — Inpatient Hospital Stay
Admission: EM | Admit: 2015-07-26 | Discharge: 2015-07-29 | DRG: 813 | Disposition: A | Discharge status: Home / Self Care | Payer: 59 | Attending: Internal Medicine | Admitting: Internal Medicine

## 2015-07-26 ENCOUNTER — Encounter: Payer: Self-pay | Admitting: Internal Medicine

## 2015-07-26 ENCOUNTER — Telehealth: Payer: Self-pay | Admitting: Hematology and Oncology

## 2015-07-26 ENCOUNTER — Emergency Department: Payer: 59

## 2015-07-26 ENCOUNTER — Telehealth: Payer: Self-pay

## 2015-07-26 DIAGNOSIS — M79606 Pain in leg, unspecified: Secondary | ICD-10-CM | POA: Diagnosis present

## 2015-07-26 DIAGNOSIS — Z91040 Latex allergy status: Secondary | ICD-10-CM

## 2015-07-26 DIAGNOSIS — IMO0002 Reserved for concepts with insufficient information to code with codable children: Secondary | ICD-10-CM

## 2015-07-26 DIAGNOSIS — Z888 Allergy status to other drugs, medicaments and biological substances status: Secondary | ICD-10-CM

## 2015-07-26 DIAGNOSIS — D693 Immune thrombocytopenic purpura: Secondary | ICD-10-CM | POA: Diagnosis not present

## 2015-07-26 DIAGNOSIS — Z7952 Long term (current) use of systemic steroids: Secondary | ICD-10-CM

## 2015-07-26 DIAGNOSIS — M79605 Pain in left leg: Secondary | ICD-10-CM

## 2015-07-26 DIAGNOSIS — R531 Weakness: Secondary | ICD-10-CM | POA: Diagnosis present

## 2015-07-26 DIAGNOSIS — Z79899 Other long term (current) drug therapy: Secondary | ICD-10-CM

## 2015-07-26 DIAGNOSIS — Z66 Do not resuscitate: Secondary | ICD-10-CM | POA: Diagnosis present

## 2015-07-26 DIAGNOSIS — M329 Systemic lupus erythematosus, unspecified: Secondary | ICD-10-CM | POA: Diagnosis present

## 2015-07-26 DIAGNOSIS — M79604 Pain in right leg: Secondary | ICD-10-CM

## 2015-07-26 DIAGNOSIS — Z886 Allergy status to analgesic agent status: Secondary | ICD-10-CM

## 2015-07-26 HISTORY — DX: Immune thrombocytopenic purpura: D69.3

## 2015-07-26 LAB — BASIC METABOLIC PANEL
Anion gap: 10 (ref 5–15)
BUN: 25 mg/dL — AB (ref 6–20)
CHLORIDE: 105 mmol/L (ref 101–111)
CO2: 22 mmol/L (ref 22–32)
CREATININE: 0.64 mg/dL (ref 0.44–1.00)
Calcium: 9.5 mg/dL (ref 8.9–10.3)
GFR calc Af Amer: >60 mL/min (ref >60)
GFR calc non Af Amer: >60 mL/min (ref >60)
Glucose, Bld: 152 mg/dL — ABNORMAL HIGH (ref 65–99)
Potassium: 4.4 mmol/L (ref 3.5–5.1)
Sodium: 137 mmol/L (ref 135–145)

## 2015-07-26 LAB — DIFFERENTIAL
BASOS ABS: 0 10*3/uL (ref 0–0.1)
Band Neutrophils: 0 %
Basophils Relative: 0 %
Blasts: 0 %
EOS PCT: 0 %
Eosinophils Absolute: 0 10*3/uL (ref 0–0.7)
LYMPHS PCT: 11 %
Lymphs Abs: 0.9 10*3/uL — ABNORMAL LOW (ref 1.0–3.6)
MONO ABS: 0.2 10*3/uL (ref 0.2–0.9)
MYELOCYTES: 0 %
Metamyelocytes Relative: 0 %
Monocytes Relative: 3 %
NEUTROS PCT: 86 %
NRBC: 0 /100{WBCs}
Neutro Abs: 7.2 10*3/uL — ABNORMAL HIGH (ref 1.4–6.5)
OTHER: 0 %
PROMYELOCYTES ABS: 0 %

## 2015-07-26 LAB — CBC
HEMATOCRIT: 37.5 % (ref 35.0–47.0)
HEMOGLOBIN: 12.4 g/dL (ref 12.0–16.0)
MCH: 29.8 pg (ref 26.0–34.0)
MCHC: 33 g/dL (ref 32.0–36.0)
MCV: 90.2 fL (ref 80.0–100.0)
Platelets: 5 10*3/uL — CL (ref 150–440)
RBC: 4.16 MIL/uL (ref 3.80–5.20)
RDW: 14.7 % — ABNORMAL HIGH (ref 11.5–14.5)
WBC: 8.5 10*3/uL (ref 3.6–11.0)

## 2015-07-26 LAB — PROTIME-INR
INR: 0.99
PROTHROMBIN TIME: 13.3 s (ref 11.4–15.0)

## 2015-07-26 LAB — APTT: aPTT: 146 seconds — ABNORMAL HIGH (ref 24–36)

## 2015-07-26 MED ORDER — MORPHINE SULFATE (PF) 4 MG/ML IV SOLN
4.0000 mg | INTRAVENOUS | Status: DC | PRN
  Administered 2015-07-26 – 2015-07-28 (×4): 4 mg via INTRAVENOUS
  Filled 2015-07-26 (×3): qty 1

## 2015-07-26 MED ORDER — PREDNISONE 20 MG PO TABS: 60.0000 mg | ORAL_TABLET | Freq: Every day | ORAL | Status: DC

## 2015-07-26 MED ORDER — MORPHINE SULFATE (PF) 4 MG/ML IV SOLN
4.0000 mg | Freq: Once | INTRAVENOUS | Status: AC
  Administered 2015-07-26: 4 mg via INTRAVENOUS
  Filled 2015-07-26: qty 1

## 2015-07-26 MED ORDER — ONDANSETRON HCL 4 MG/2ML IJ SOLN
4.0000 mg | Freq: Once | INTRAMUSCULAR | Status: AC
  Administered 2015-07-26: 4 mg via INTRAVENOUS
  Filled 2015-07-26: qty 2

## 2015-07-26 MED ORDER — ONDANSETRON HCL 4 MG PO TABS: 4.0000 mg | ORAL_TABLET | Freq: Four times a day (QID) | ORAL | Status: DC | PRN

## 2015-07-26 MED ORDER — ACETAMINOPHEN 325 MG PO TABS
650.0000 mg | ORAL_TABLET | Freq: Four times a day (QID) | ORAL | Status: DC | PRN
  Filled 2015-07-26: qty 2

## 2015-07-26 MED ORDER — MORPHINE SULFATE (PF) 4 MG/ML IV SOLN
INTRAVENOUS | Status: AC
  Filled 2015-07-26: qty 1

## 2015-07-26 MED ORDER — ACETAMINOPHEN 650 MG RE SUPP: 650.0000 mg | Freq: Four times a day (QID) | RECTAL | Status: DC | PRN

## 2015-07-26 MED ORDER — ONDANSETRON HCL 4 MG/2ML IJ SOLN: 4.0000 mg | Freq: Four times a day (QID) | INTRAMUSCULAR | Status: DC | PRN

## 2015-07-26 NOTE — Telephone Encounter (Signed)
Pt called at noon with complaints of severe lower leg pain/cramping 8/10 on pain scale since last night.  Per pt she even had to wear her sneakers today.  MD notified and is going to speak with pt regarding newer symptoms.

## 2015-07-26 NOTE — ED Notes (Addendum)
Pt states she was sent by her MD for evaluation of her leg cramping and states "she told me because of my blood reults to come", pt ambulatory to room, brusing noticed on bilateral legs
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
## 2015-07-26 NOTE — ED Provider Notes (Signed)
[facility] Emergency Department Provider Note  ____________________________________________  Time seen: Approximately 5:59 PM  I have reviewed the triage vital signs and the nursing notes.   HISTORY  Chief Complaint Leg Pain    HPI Dana [name] is a 54 y.o. 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 Mike Gip (hematology) and who has a history of TTP requiring hospitalization who presents today with severe bilateral lower extremity leg cramps and pain from just below the knee down to the feet.  This is in the setting of petechiae and recent diagnosis of TCP again in the clinic with Dr. Mike Gip.  I spoke by phone with Dr. Mike Gip who explain her concern that the patient may benefit from hospitalization for IV steroids, which she responded to well in the past, but also that if the leg pain represents a DVT in the setting of severe thrombocytopenia (platelets yesterday were 5000), she would need to be transferred to [facility] for high level of care.  The patient denies any recent fever/chills, chest pain, shortness of breath, abdominal pain, nausea/vomiting/diarrhea, and dysuria.  She describes the pain as severe cramping, intermittent, nothing makes it better and nothing makes it worse.  It is all throughout her lower extremities from just below the knee down.   Past Medical History  Diagnosis Date  . Lupus ([place])   . Uterine fibroid   . Hemorrhoid     Patient Active Problem List   Diagnosis Date Noted  . ITP (idiopathic thrombocytopenic purpura) 04/10/2015  . Thrombocytopenia ([place]) 04/07/2015  . Fever 04/07/2015  . Disseminated lupus erythematosus ([place]) 02/10/2011    Past Surgical History  Procedure Laterality Date  . Pelvic laparoscopy    . Carpal tunnel release      bilateral    Current Outpatient Rx  Name  Route  Sig  Dispense  Refill  . acetaminophen (TYLENOL) 500 MG tablet   Oral   Take  500 mg by mouth every 6 (six) hours as needed for mild pain, moderate pain, fever or headache.          . Calcium-Vitamin D-Vitamin K 500-100-40 MG-UNT-MCG CHEW   Oral   Chew 1 each by mouth daily.         . Multiple Vitamin (MULTIVITAMIN) tablet   Oral   Take 1 tablet by mouth daily.         . predniSONE (DELTASONE) 20 MG tablet   Oral   Take 3 tablets (60 mg total) by mouth daily with breakfast.   60 tablet   0   . feeding supplement (BOOST / RESOURCE BREEZE) LIQD   Oral   Take 1 Container by mouth 2 (two) times daily between meals. Patient not taking: Reported on 07/26/2015   1 Container   0     Allergies Aspirin; Latex; and Protonix  Family History  Problem Relation Age of Onset  . Hypertension Mother   . Cancer Father     porstate  . Diabetes Father   . Hypertension Sister     Social History Social History  Substance Use Topics  . Smoking status: Never Smoker   . Smokeless tobacco: None  . Alcohol Use: No    Review of Systems Constitutional: No fever/chills Eyes: No visual changes. ENT: No sore throat. Cardiovascular: Denies chest pain. Respiratory: Denies shortness of breath. Gastrointestinal: No abdominal pain.  No nausea, no vomiting.  No diarrhea.  No constipation.  Genitourinary: Negative for dysuria. Musculoskeletal: Negative for back pain.  Severe bilateral lower extremity pain Skin: Petechiae throughout the lower extremities and bruising on multiple skin surfaces Neurological: Negative for headaches, focal weakness or numbness.  10-point ROS otherwise negative.  ____________________________________________   PHYSICAL EXAM:  VITAL SIGNS: ED Triage Vitals  Enc Vitals Group     BP 07/26/15 1734 176/95 mmHg     Pulse Rate 07/26/15 1734 83     Resp 07/26/15 1734 18     Temp 07/26/15 1734 98.3 F (36.8 C)     Temp Source 07/26/15 1734 Oral     SpO2 07/26/15 1734 100 %     Weight 07/26/15 1734 136 lb (61.689 kg)     Height 07/26/15  1734 5\' 7"  (1.702 m)     Head Cir --      Peak Flow --      Pain Score --      Pain Loc --      Pain Edu? --      Excl. in [place]? --     Constitutional: Alert and oriented. Well appearing and in no acute distress. Eyes: Conjunctivae are normal. PERRL. EOMI. Head: Atraumatic. Nose: No congestion/rhinnorhea. Mouth/Throat: Mucous membranes are moist.  Oropharynx non-erythematous. Neck: No stridor.   Cardiovascular: Normal rate, regular rhythm. Grossly normal heart sounds.  Good peripheral circulation. Respiratory: Normal respiratory effort.  No retractions. Lungs CTAB. Gastrointestinal: Soft and nontender. No distention. No abdominal bruits. No CVA tenderness. Musculoskeletal: No lower extremity tenderness nor edema.  No joint effusions. Neurologic:  Normal speech and language. No gross focal neurologic deficits are appreciated.  Skin:  Skin is warm, dry and intact.  Petechiae with multiple bruises throughout skin surface Psychiatric: Mood and affect are normal. Speech and behavior are normal.  ____________________________________________   LABS (all labs ordered are listed, but only abnormal results are displayed)  Labs Reviewed  CBC - Abnormal; Notable for the following:    RDW 14.7 (*)    Platelets 5 (*)    All other components within normal limits  BASIC METABOLIC PANEL - Abnormal; Notable for the following:    Glucose, Bld 152 (*)    BUN 25 (*)    All other components within normal limits  DIFFERENTIAL - Abnormal; Notable for the following:    Neutro Abs 7.2 (*)    Lymphs Abs 0.9 (*)    All other components within normal limits  APTT - Abnormal; Notable for the following:    aPTT 146 (*)    All other components within normal limits  PROTIME-INR   ____________________________________________  EKG  Not indicated ____________________________________________  RADIOLOGY   US Venous Img Lower Bilateral  07/26/2015  CLINICAL DATA:  54 year old female with bilateral  leg pain and tenderness EXAM: BILATERAL LOWER EXTREMITY VENOUS DOPPLER ULTRASOUND TECHNIQUE: Gray-scale sonography with graded compression, as well as color Doppler and duplex ultrasound were performed to evaluate the lower extremity deep venous systems from the level of the common femoral vein and including the common femoral, femoral, profunda femoral, popliteal and calf veins including the posterior tibial, peroneal and gastrocnemius veins when visible. The superficial great saphenous vein was also interrogated. Spectral Doppler was utilized to evaluate flow at rest and with distal augmentation maneuvers in the common femoral, femoral and popliteal veins. COMPARISON:  None. FINDINGS: RIGHT LOWER EXTREMITY Common Femoral Vein: No evidence of thrombus. Normal compressibility, respiratory phasicity and response to augmentation. Saphenofemoral Junction: No evidence of thrombus. Normal compressibility and flow on  color Doppler imaging. Profunda Femoral Vein: No evidence of thrombus. Normal compressibility and flow on color Doppler imaging. Femoral Vein: No evidence of thrombus. Normal compressibility, respiratory phasicity and response to augmentation. Popliteal Vein: No evidence of thrombus. Normal compressibility, respiratory phasicity and response to augmentation. Calf Veins: No evidence of thrombus. Normal compressibility and flow on color Doppler imaging. Superficial Great Saphenous Vein: No evidence of thrombus. Normal compressibility and flow on color Doppler imaging. LEFT LOWER EXTREMITY Common Femoral Vein: No evidence of thrombus. Normal compressibility, respiratory phasicity and response to augmentation. Saphenofemoral Junction: No evidence of thrombus. Normal compressibility and flow on color Doppler imaging. Profunda Femoral Vein: No evidence of thrombus. Normal compressibility and flow on color Doppler imaging. Femoral Vein: No evidence of thrombus. Normal compressibility, respiratory phasicity and  response to augmentation. Popliteal Vein: No evidence of thrombus. Normal compressibility, respiratory phasicity and response to augmentation. Calf Veins: No evidence of thrombus. Normal compressibility and flow on color Doppler imaging. Superficial Great Saphenous Vein: No evidence of thrombus. Normal compressibility and flow on color Doppler imaging. IMPRESSION: No evidence of deep venous thrombosis. Electronically Signed   By: Anner Crete M.D.   On: 07/26/2015 21:18    ____________________________________________   PROCEDURES  Procedure(s) performed: None  Critical Care performed: No ____________________________________________   INITIAL IMPRESSION / ASSESSMENT AND PLAN / ED COURSE  Pertinent labs & imaging results that were available during my care of the patient were reviewed by me and considered in my medical decision making (see chart for details).  As per my discussion with Dr. Mike Gip and with the patient, we will proceed with the ultrasound Doppler study of her bilateral lower extremities.  If the study is negative then I will admit her at [facility] for further management of her TTP.  If her study is positive I will transfer her to [facility].  I am giving the patient IV analgesia and antiemetics at this time.  Dr. Mike Gip and the patient are both in agreement with this plan.  ----------------------------------------- 9:42 PM on 07/26/2015 -----------------------------------------  Patient is feeling better after IV analgesia and is ambulatory to and from the bathroom.  Doppler is negative for DVT.  I spoke again by phone with Dr. Mike Gip and we all agreed again on the plan for admission here at [facility].  ____________________________________________  FINAL CLINICAL IMPRESSION(S) / ED DIAGNOSES  Final diagnoses:  History of lupus  ITP (idiopathic thrombocytopenic purpura)  Bilateral lower extremity pain      NEW  MEDICATIONS STARTED DURING THIS VISIT:  New Prescriptions   No medications on file     Hinda Kehr, MD 07/26/15 2142

## 2015-07-26 NOTE — ED Notes (Signed)
Pt was at MD office yesterday and labs resulted with platelet of 5.  Has hx ITP

## 2015-07-26 NOTE — H&P (Signed)
[place] [facility] [place] NAME: Dana [name]    MR#:  [id number]  DATE OF BIRTH:  [birth year]  DATE OF ADMISSION:  07/26/2015  PRIMARY CARE PHYSICIAN: Pcp Not In System   REQUESTING/REFERRING PHYSICIAN: Karma Greaser, M.D.  CHIEF COMPLAINT:   Chief Complaint  Patient presents with  . Leg Pain    HISTORY OF PRESENT ILLNESS:  Dana [name]  is a 54 y.o. female who presents with bilateral lower extremity leg pain. Patient has a known history of ITP, and was seen by Dr. Mike Gip in hematology clinic yesterday with a platelet count of 5000, and started on steroids. Her platelet count in the ED today is still 5000. She came to the ED today at Dr. Kem Parkinson request, when she called the complaining of bilateral lower extremity leg pain. Dr. Mike Gip was concerned that she may possibly have a DVT. Ultrasound in the ED showed no DVT. However, Dr. Mike Gip had already discussed with her possible inpatient management for her ITP as well. For this reason, hospitalists were called for admission after the ED physician consulted with Dr. Mike Gip by telephone.  PAST MEDICAL HISTORY:   Past Medical History  Diagnosis Date  . Lupus ([place])   . Uterine fibroid   . Hemorrhoid   . ITP (idiopathic thrombocytopenic purpura)     PAST SURGICAL HISTORY:   Past Surgical History  Procedure Laterality Date  . Pelvic laparoscopy    . Carpal tunnel release      bilateral    SOCIAL HISTORY:   Social History  Substance Use Topics  . Smoking status: Never Smoker   . Smokeless tobacco: Not on file  . Alcohol Use: No    FAMILY HISTORY:   Family History  Problem Relation Age of Onset  . Hypertension Mother   . Cancer Father     porstate  . Diabetes Father   . Hypertension Sister     DRUG ALLERGIES:   Allergies  Allergen Reactions  . Aspirin     Stomach issue  . Latex Swelling    Patient said she can use latex gloves now.  . Protonix [Pantoprazole  Sodium] Other (See Comments)    Cramping in lower legs     MEDICATIONS AT HOME:   Prior to Admission medications   Medication Sig Start Date End Date Taking? Authorizing Provider  acetaminophen (TYLENOL) 500 MG tablet Take 500 mg by mouth every 6 (six) hours as needed for mild pain, moderate pain, fever or headache.    Yes Historical Provider, MD  Calcium-Vitamin D-Vitamin K 500-100-40 MG-UNT-MCG CHEW Chew 1 each by mouth daily.   Yes Historical Provider, MD  Multiple Vitamin (MULTIVITAMIN) tablet Take 1 tablet by mouth daily.   Yes Historical Provider, MD  predniSONE (DELTASONE) 20 MG tablet Take 3 tablets (60 mg total) by mouth daily with breakfast. 07/25/15  Yes Lequita Asal, MD  feeding supplement (BOOST / RESOURCE BREEZE) LIQD Take 1 Container by mouth 2 (two) times daily between meals. Patient not taking: Reported on 07/26/2015 04/10/15   Bettey Costa, MD    REVIEW OF SYSTEMS:  Review of Systems  Constitutional: Negative for fever, chills, weight loss and malaise/fatigue.  HENT: Negative for ear pain, hearing loss and tinnitus.   Eyes: Negative for blurred vision, double vision, pain and redness.  Respiratory: Negative for cough, hemoptysis and shortness of breath.   Cardiovascular: Negative for chest pain, palpitations, orthopnea and leg swelling.  Gastrointestinal: Negative for nausea, vomiting,  abdominal pain, diarrhea and constipation.  Genitourinary: Negative for dysuria, frequency and hematuria.  Musculoskeletal: Negative for back pain, joint pain and neck pain.       Bilateral lower extremity pain  Skin:       Lower extremity petechiae and scattered bruising, No acne or rash  Neurological: Negative for dizziness, tremors, focal weakness and weakness.  Endo/Heme/Allergies: Negative for polydipsia. Does not bruise/bleed easily.  Psychiatric/Behavioral: Negative for depression. The patient is not nervous/anxious and does not have insomnia.      VITAL SIGNS:   Filed  Vitals:   07/26/15 2058 07/26/15 2100 07/26/15 2130 07/26/15 2201  BP: 161/85 160/85 148/81 145/77  Pulse: 75 77 80 71  Temp:      TempSrc:      Resp: 16   18  Height:      Weight:      SpO2: 99% 97% 98% 98%   Wt Readings from Last 3 Encounters:  07/26/15 61.689 kg (136 lb)  07/25/15 62.8 kg (138 lb 7.2 oz)  06/13/15 61.4 kg (135 lb 5.8 oz)    PHYSICAL EXAMINATION:  Physical Exam  Vitals reviewed. Constitutional: She is oriented to person, place, and time. She appears well-developed and well-nourished. No distress.  HENT:  Head: Normocephalic and atraumatic.  Mouth/Throat: Oropharynx is clear and moist.  Eyes: Conjunctivae and EOM are normal. Pupils are equal, round, and reactive to light. No scleral icterus.  Neck: Normal range of motion. Neck supple. No JVD present. No thyromegaly present.  Cardiovascular: Normal rate, regular rhythm and intact distal pulses.  Exam reveals no gallop and no friction rub.   No murmur heard. Respiratory: Effort normal and breath sounds normal. No respiratory distress. She has no wheezes. She has no rales.  GI: Soft. Bowel sounds are normal. She exhibits no distension. There is no tenderness.  Musculoskeletal: Normal range of motion. She exhibits no edema.  Bilateral lower extremity leg pain  Lymphadenopathy:    She has no cervical adenopathy.  Neurological: She is alert and oriented to person, place, and time. No cranial nerve deficit.  No dysarthria, no aphasia  Skin: Skin is warm and dry. No erythema.  Bilateral lower extremity petechiae with some scattered ecchymosis elsewhere.  Psychiatric: She has a normal mood and affect. Her behavior is normal. Judgment and thought content normal.    LABORATORY PANEL:   CBC  Recent Labs Lab 07/26/15 1750  WBC 8.5  HGB 12.4  HCT 37.5  PLT 5*   ------------------------------------------------------------------------------------------------------------------  Chemistries   Recent Labs Lab  07/25/15 1115 07/26/15 1750  NA 138 137  K 4.5 4.4  CL 98* 105  CO2 29 22  GLUCOSE 95 152*  BUN 19 25*  CREATININE 0.80 0.64  CALCIUM 9.1 9.5  MG 2.0  --    ------------------------------------------------------------------------------------------------------------------  Cardiac Enzymes No results for input(s): TROPONINI in the last 168 hours. ------------------------------------------------------------------------------------------------------------------  RADIOLOGY:  US Venous Img Lower Bilateral  07/26/2015  CLINICAL DATA:  54 year old female with bilateral leg pain and tenderness EXAM: BILATERAL LOWER EXTREMITY VENOUS DOPPLER ULTRASOUND TECHNIQUE: Gray-scale sonography with graded compression, as well as color Doppler and duplex ultrasound were performed to evaluate the lower extremity deep venous systems from the level of the common femoral vein and including the common femoral, femoral, profunda femoral, popliteal and calf veins including the posterior tibial, peroneal and gastrocnemius veins when visible. The superficial great saphenous vein was also interrogated. Spectral Doppler was utilized to evaluate flow at rest and with distal augmentation  maneuvers in the common femoral, femoral and popliteal veins. COMPARISON:  None. FINDINGS: RIGHT LOWER EXTREMITY Common Femoral Vein: No evidence of thrombus. Normal compressibility, respiratory phasicity and response to augmentation. Saphenofemoral Junction: No evidence of thrombus. Normal compressibility and flow on color Doppler imaging. Profunda Femoral Vein: No evidence of thrombus. Normal compressibility and flow on color Doppler imaging. Femoral Vein: No evidence of thrombus. Normal compressibility, respiratory phasicity and response to augmentation. Popliteal Vein: No evidence of thrombus. Normal compressibility, respiratory phasicity and response to augmentation. Calf Veins: No evidence of thrombus. Normal compressibility and flow on  color Doppler imaging. Superficial Great Saphenous Vein: No evidence of thrombus. Normal compressibility and flow on color Doppler imaging. LEFT LOWER EXTREMITY Common Femoral Vein: No evidence of thrombus. Normal compressibility, respiratory phasicity and response to augmentation. Saphenofemoral Junction: No evidence of thrombus. Normal compressibility and flow on color Doppler imaging. Profunda Femoral Vein: No evidence of thrombus. Normal compressibility and flow on color Doppler imaging. Femoral Vein: No evidence of thrombus. Normal compressibility, respiratory phasicity and response to augmentation. Popliteal Vein: No evidence of thrombus. Normal compressibility, respiratory phasicity and response to augmentation. Calf Veins: No evidence of thrombus. Normal compressibility and flow on color Doppler imaging. Superficial Great Saphenous Vein: No evidence of thrombus. Normal compressibility and flow on color Doppler imaging. IMPRESSION: No evidence of deep venous thrombosis. Electronically Signed   By: Anner Crete M.D.   On: 07/26/2015 21:18    EKG:  No orders found for this or any previous visit.  IMPRESSION AND PLAN:  Principal Problem:   ITP (idiopathic thrombocytopenic purpura) - continue steroids until started by Dr. Mike Gip, patient's hematologist. Monitor closely for any signs of bleeding. Hematology consult. Unclear if her leg pain is related to her ITP, as it is certainly temporally coincidental. Active Problems:   Disseminated lupus erythematosus (HCC) - leg pain is not consistent with the patient's prior lupus flares. She was initially on prednisone and Plaquenil for lupus, and then was weaned off without any significant recent recurrences. Unclear for current leg pain could be related to her lupus or not, though this seems less likely. We'll monitor closely and follow hematology's recommendation as to whether or not they think a rheumatology consult is necessary.   Leg pain - unclear  etiology at this time, however the patient responded well to pain control. We'll continue IV pain medications at this time, and continue workup as above.  All the records are reviewed and case discussed with ED provider. Management plans discussed with the patient and/or family.  DVT PROPHYLAXIS: No pharmacologic DVT prophylaxis due to platelet count of 5000, and no mechanical prophylaxis due to bilateral lower extremity leg pain.  ADMISSION STATUS: Observation  CODE STATUS: Full Advance Directive Documentation        Most Recent Value   Type of Advance Directive  Healthcare Power of Attorney   Pre-existing out of facility DNR order (yellow form or pink MOST form)     "MOST" Form in Place?        TOTAL TIME TAKING CARE OF THIS PATIENT: 40 minutes.    Bowen Kia Coral 07/26/2015, 10:30 PM  [facility]  Office  [phone]  CC: Primary care physician; Pcp Not In System

## 2015-07-26 NOTE — ED Notes (Signed)
Pt taken to US

## 2015-07-26 NOTE — ED Notes (Signed)
Pt returned from US

## 2015-07-26 NOTE — ED Notes (Signed)
Report to jennifer rn

## 2015-07-27 ENCOUNTER — Inpatient Hospital Stay: Payer: 59

## 2015-07-27 ENCOUNTER — Other Ambulatory Visit: Payer: Self-pay | Admitting: Hematology and Oncology

## 2015-07-27 DIAGNOSIS — Z886 Allergy status to analgesic agent status: Secondary | ICD-10-CM | POA: Diagnosis not present

## 2015-07-27 DIAGNOSIS — D696 Thrombocytopenia, unspecified: Secondary | ICD-10-CM | POA: Diagnosis not present

## 2015-07-27 DIAGNOSIS — Z888 Allergy status to other drugs, medicaments and biological substances status: Secondary | ICD-10-CM | POA: Diagnosis not present

## 2015-07-27 DIAGNOSIS — Z91040 Latex allergy status: Secondary | ICD-10-CM | POA: Diagnosis not present

## 2015-07-27 DIAGNOSIS — D693 Immune thrombocytopenic purpura: Secondary | ICD-10-CM | POA: Diagnosis present

## 2015-07-27 DIAGNOSIS — R531 Weakness: Secondary | ICD-10-CM | POA: Diagnosis present

## 2015-07-27 DIAGNOSIS — M329 Systemic lupus erythematosus, unspecified: Secondary | ICD-10-CM | POA: Diagnosis not present

## 2015-07-27 DIAGNOSIS — Z79899 Other long term (current) drug therapy: Secondary | ICD-10-CM | POA: Diagnosis not present

## 2015-07-27 DIAGNOSIS — Z7952 Long term (current) use of systemic steroids: Secondary | ICD-10-CM | POA: Diagnosis not present

## 2015-07-27 DIAGNOSIS — Z66 Do not resuscitate: Secondary | ICD-10-CM | POA: Diagnosis present

## 2015-07-27 DIAGNOSIS — M79669 Pain in unspecified lower leg: Secondary | ICD-10-CM | POA: Diagnosis not present

## 2015-07-27 LAB — CBC
HEMATOCRIT: 34.3 % — AB (ref 35.0–47.0)
Hemoglobin: 11.2 g/dL — ABNORMAL LOW (ref 12.0–16.0)
MCH: 29.3 pg (ref 26.0–34.0)
MCHC: 32.6 g/dL (ref 32.0–36.0)
MCV: 89.8 fL (ref 80.0–100.0)
Platelets: <5 10*3/uL — CL (ref 150–440)
RBC: 3.82 MIL/uL (ref 3.80–5.20)
RDW: 14.6 % — ABNORMAL HIGH (ref 11.5–14.5)
WBC: 7.3 10*3/uL (ref 3.6–11.0)

## 2015-07-27 LAB — BASIC METABOLIC PANEL
ANION GAP: 4 — AB (ref 5–15)
BUN: 21 mg/dL — ABNORMAL HIGH (ref 6–20)
CO2: 29 mmol/L (ref 22–32)
Calcium: 8.9 mg/dL (ref 8.9–10.3)
Chloride: 106 mmol/L (ref 101–111)
Creatinine, Ser: 0.65 mg/dL (ref 0.44–1.00)
GFR calc Af Amer: >60 mL/min (ref >60)
GFR calc non Af Amer: >60 mL/min (ref >60)
GLUCOSE: 86 mg/dL (ref 65–99)
POTASSIUM: 4.2 mmol/L (ref 3.5–5.1)
Sodium: 139 mmol/L (ref 135–145)

## 2015-07-27 LAB — TYPE AND SCREEN
ABO/RH(D): O POS
Antibody Screen: NEGATIVE

## 2015-07-27 LAB — MAGNESIUM: Magnesium: 1.9 mg/dL (ref 1.7–2.4)

## 2015-07-27 LAB — FIBRINOGEN: Fibrinogen: 303 mg/dL (ref 210–470)

## 2015-07-27 MED ORDER — IMMUNE GLOBULIN (HUMAN) 20 GM/200ML IV SOLN
0.5000 g/kg | INTRAVENOUS | Status: DC
  Administered 2015-07-27 – 2015-07-29 (×3): 30 g via INTRAVENOUS
  Filled 2015-07-27 (×5): qty 100

## 2015-07-27 MED ORDER — METHYLPREDNISOLONE SODIUM SUCC 125 MG IJ SOLR
60.0000 mg | Freq: Two times a day (BID) | INTRAMUSCULAR | Status: DC
  Administered 2015-07-27 – 2015-07-29 (×5): 60 mg via INTRAVENOUS
  Filled 2015-07-27 (×5): qty 2

## 2015-07-27 MED ORDER — ACETAMINOPHEN 325 MG PO TABS
650.0000 mg | ORAL_TABLET | Freq: Four times a day (QID) | ORAL | Status: DC | PRN
  Administered 2015-07-28: 650 mg via ORAL

## 2015-07-27 MED ORDER — IMMUNE GLOBULIN (HUMAN) 10 GM/100ML IV SOLN: 0.5000 g/kg | INTRAVENOUS | Status: DC

## 2015-07-27 MED ORDER — DIPHENHYDRAMINE HCL 25 MG PO CAPS: 25.0000 mg | ORAL_CAPSULE | Freq: Four times a day (QID) | ORAL | Status: DC | PRN

## 2015-07-27 NOTE — Progress Notes (Addendum)
Pt's platelets are less <5. Dr. Posey Pronto notified, stated to continue to monitor, okay if no active bleeding is present; order a type and screen.

## 2015-07-27 NOTE — Progress Notes (Signed)
Gen. Surgery I discussed with Dr. Mike Gip the patient's care and condition. Dr. Mike Gip asked me to standby in case a bleeding problem occurred during treatment. I met the patient and visited introducing myself should she need a Education officer, environmental.

## 2015-07-27 NOTE — Plan of Care (Signed)
Problem: Education: Goal: Knowledge of [place] [facility] Education information/materials will improve Outcome: Progressing Patient to receive IVIG this afternoon for a platelet level of less than 5. Patient educated by Dr. Mike Gip. Sister at bedside.   Problem: Safety: Goal: Ability to remain free from injury will improve Outcome: Progressing Patient ambulating independently to the bathroom.   Problem: Pain Managment: Goal: General experience of comfort will improve Outcome: Progressing No complaints of pain.

## 2015-07-27 NOTE — Plan of Care (Signed)
Problem: Education: Goal: Knowledge of [place] [facility] Education information/materials will improve Outcome: Progressing Pt has generalized petechiae.  No s/s of bleeding at this time.  Problem: Safety: Goal: Ability to remain free from injury will improve Outcome: Progressing Pt is a low fall risk, ambulates independently with a steady gait. Pt encouraged to call for assistance when needed. Sister at bedside at this time.  Problem: Pain Managment: Goal: General experience of comfort will improve Outcome: Progressing Pt c/o bilateral lower pain, morphine 4 mg available q4 hours as needed, given twice in the ED.

## 2015-07-27 NOTE — Progress Notes (Signed)
[facility] [place] NAME: Dana [name]    MR#:  [id number]  DATE OF BIRTH:  Oct 29, 1960  SUBJECTIVE:    Patient says morphine helps with her bilateral leg pain. She denies dark colored stools or vomiting blood. She says she was flossing a few days ago and noticed that her gums were bleeding.  REVIEW OF SYSTEMS:    Review of Systems  Constitutional: Negative for fever, chills and malaise/fatigue.  HENT: Negative for sore throat.   Eyes: Negative for blurred vision.  Respiratory: Negative for cough, hemoptysis, shortness of breath and wheezing.   Cardiovascular: Negative for chest pain, palpitations and leg swelling.  Gastrointestinal: Negative for nausea, vomiting, abdominal pain, diarrhea and blood in stool.  Genitourinary: Negative for dysuria.  Musculoskeletal: Negative for back pain.       Leg pain  Skin:       Petechiae  Neurological: Negative for dizziness, tremors and headaches.  Endo/Heme/Allergies: Bruises/bleeds easily.    Tolerating Diet: Yes      DRUG ALLERGIES:   Allergies  Allergen Reactions  . Aspirin     Stomach issue  . Latex Swelling    Patient said she can use latex gloves now.  . Protonix [Pantoprazole Sodium] Other (See Comments)    Cramping in lower legs     VITALS:  Blood pressure 147/79, pulse 70, temperature 97.8 F (36.6 C), temperature source Oral, resp. rate 18, height 5\' 7"  (1.702 m), weight 61.735 kg (136 lb 1.6 oz), SpO2 100 %.  PHYSICAL EXAMINATION:   Physical Exam  Constitutional: She is oriented to person, place, and time and well-developed, well-nourished, and in no distress. No distress.  HENT:  Head: Normocephalic.  Eyes: No scleral icterus.  Neck: Normal range of motion. Neck supple. No JVD present. No tracheal deviation present.  Cardiovascular: Normal rate, regular rhythm and normal heart sounds.  Exam reveals no gallop and no friction rub.   No murmur  heard. Pulmonary/Chest: Effort normal and breath sounds normal. No respiratory distress. She has no wheezes. She has no rales. She exhibits no tenderness.  Abdominal: Soft. Bowel sounds are normal. She exhibits no distension and no mass. There is no tenderness. There is no rebound and no guarding.  Musculoskeletal: Normal range of motion. She exhibits no edema.  Neurological: She is alert and oriented to person, place, and time.  Skin: Skin is warm. No rash noted. No erythema.  Lower external he bilateral petechiae  Psychiatric: Affect and judgment normal.      LABORATORY PANEL:   CBC  Recent Labs Lab 07/27/15 0504  WBC 7.3  HGB 11.2*  HCT 34.3*  PLT <5*   ------------------------------------------------------------------------------------------------------------------  Chemistries   Recent Labs Lab 07/27/15 0504  NA 139  K 4.2  CL 106  CO2 29  GLUCOSE 86  BUN 21*  CREATININE 0.65  CALCIUM 8.9  MG 1.9   ------------------------------------------------------------------------------------------------------------------  Cardiac Enzymes No results for input(s): TROPONINI in the last 168 hours. ------------------------------------------------------------------------------------------------------------------  RADIOLOGY:  US Venous Img Lower Bilateral  07/26/2015  CLINICAL DATA:  54 year old female with bilateral leg pain and tenderness EXAM: BILATERAL LOWER EXTREMITY VENOUS DOPPLER ULTRASOUND TECHNIQUE: Gray-scale sonography with graded compression, as well as color Doppler and duplex ultrasound were performed to evaluate the lower extremity deep venous systems from the level of the common femoral vein and including the common femoral, femoral, profunda femoral, popliteal and calf veins including the posterior tibial, peroneal and gastrocnemius veins when visible.  The superficial great saphenous vein was also interrogated. Spectral Doppler was utilized to evaluate flow at  rest and with distal augmentation maneuvers in the common femoral, femoral and popliteal veins. COMPARISON:  None. FINDINGS: RIGHT LOWER EXTREMITY Common Femoral Vein: No evidence of thrombus. Normal compressibility, respiratory phasicity and response to augmentation. Saphenofemoral Junction: No evidence of thrombus. Normal compressibility and flow on color Doppler imaging. Profunda Femoral Vein: No evidence of thrombus. Normal compressibility and flow on color Doppler imaging. Femoral Vein: No evidence of thrombus. Normal compressibility, respiratory phasicity and response to augmentation. Popliteal Vein: No evidence of thrombus. Normal compressibility, respiratory phasicity and response to augmentation. Calf Veins: No evidence of thrombus. Normal compressibility and flow on color Doppler imaging. Superficial Great Saphenous Vein: No evidence of thrombus. Normal compressibility and flow on color Doppler imaging. LEFT LOWER EXTREMITY Common Femoral Vein: No evidence of thrombus. Normal compressibility, respiratory phasicity and response to augmentation. Saphenofemoral Junction: No evidence of thrombus. Normal compressibility and flow on color Doppler imaging. Profunda Femoral Vein: No evidence of thrombus. Normal compressibility and flow on color Doppler imaging. Femoral Vein: No evidence of thrombus. Normal compressibility, respiratory phasicity and response to augmentation. Popliteal Vein: No evidence of thrombus. Normal compressibility, respiratory phasicity and response to augmentation. Calf Veins: No evidence of thrombus. Normal compressibility and flow on color Doppler imaging. Superficial Great Saphenous Vein: No evidence of thrombus. Normal compressibility and flow on color Doppler imaging. IMPRESSION: No evidence of deep venous thrombosis. Electronically Signed   By: Anner Crete M.D.   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  1. ITP: Patient will continue high-dose IV steroids. Oncology has been consulted. She was just seen by her hematologist yesterday and the plan was for outpatient treatment for her platelets. However due to her lower extremity pain she presented to the emergency room. Further treatment plan for her low platelet count will be discussed by her oncologist.  2. Bilateral leg pain: I suspect this could be due to her PTCA and low platelet count. Dopplers are negative for DVT.  3. Disseminated lupus: She is not currently on any medications at this time.    Management plans discussed with the patient and she is in agreement.  CODE STATUS: FULL  TOTAL TIME TAKING CARE OF THIS PATIENT: 30 minutes.     POSSIBLE D/C 3 days, DEPENDING ON CLINICAL CONDITION.   Kayde Atkerson M.D on 07/27/2015 at 10:16 AM  Between 7am to 6pm - Pager - [phone] After 6pm go to [email or web address] - password [email or web address] [facility]  Office  [phone]  CC: Primary care physician; Pcp Not In System  Note: This dictation was prepared with Dragon dictation along with smaller phrase technology. Any transcriptional errors that result from this process are unintentional.

## 2015-07-27 NOTE — Progress Notes (Signed)
[facility]  Date of admission:  07/27/2015  Inpatient day:  07/27/2015  Consulting physician: Bettey Costa, MD  Chief Complaint: Dana [name] is a 54 y.o. female with lupus and a history of ITP who was admitted from the emergency room with thrombocytopenia and lower extremity pain.  HPI: The patient states that she was diagnosed with lupus in 2001. She initially presented with flulike symptoms, fever and joint aches. She was seen by a rheumatologist in [place] as well as one at the [facility] [place]. She was treated with Plaquenil and prednisone for 4 years. Her medications were eventually tapered off. She has not been seen for her lupus in 3 years. She denies any prior history of renal or blood issues associated with her lupus.  She is scheduled to see [name] at [facility].  She was last admitted to [facility] on 04/07/2015 with a platelet count of 5,000.  Two weeks prior,  she had diarrhea, a low grade fever, and a slight sore throat. Work-up included the following normal labs: PT/INR, fibrinogen, B12, folate, TSH , HIV screen, hepatitis C antibody, and H. pylori serologies . Hepatitis B core antibody total and hepatitis B surface antigen were negative. Hepatitis B surface antibody was positive consistent with immunity. Immunoglobulin levels revealed an IgG of 2894, IgA 156, and IgM 123. Lupus anticoagulant panel was negative. PTT corrected with mix (? factor deficiency VIII, IX, XI, XII, HMWK, prekallikrein). LDH was 217 (98-192). Uric acid was 3.0.  She was started on solumedrol 60 mg IV q 12 hours.  Platelet count was 6,000 on 04/08/2015, 7,000 on 04/09/2015, 10,000 on 04/09/2015, and 26,000 on 04/10/2015.  Shewas discharged on 04/10/2015 on prednisone 60 mg a day.  Her platelet count was 110,000 on 04/25/2015 and 250,000 on 05/02/2015.  She was on a slow steroid taper until 06/20/2015. Platelet count at discontinuation of steroids was 133,000.  She  presented to clinic on 07/25/2015 for 1 month month follow-up visit.  She noted a 2 day history of leg cramps and a 1 day history of lower extremity petechiae. She had transient epistaxis 2 days prior. Exam revealed lower extremity petechiae.  She was started on prednisone 60 mg a day.  On 07/26/2015, she noted worsening lower extremity pain.  She denied any trauma, swelling or bleeding.   ER evaluation with lower extremity duplex revealed no evidence of DVT.  She denied any bleeding.  Platelet count was 5,000.  She was admitted for pain management and treatment of her ITP.  Past Medical History  Diagnosis Date  . Lupus ([place])   . Uterine fibroid   . Hemorrhoid   . ITP (idiopathic thrombocytopenic purpura)     Past Surgical History  Procedure Laterality Date  . Pelvic laparoscopy    . Carpal tunnel release      bilateral    Family History  Problem Relation Age of Onset  . Hypertension Mother   . Cancer Father     porstate  . Diabetes Father   . Hypertension Sister     Social History:  reports that she has never smoked. She does not have any smokeless tobacco history on file. She reports that she does not drink alcohol or use illicit drugs.  She is a Pharmacist, community.  She is from [place].  She has lived in [place] for 9 years.  She is accompanied by her sister.    Allergies:  Allergies  Allergen Reactions  . Aspirin  Stomach issue  . Latex Swelling    Patient said she can use latex gloves now.  . Protonix [Pantoprazole Sodium] Other (See Comments)    Cramping in lower legs     Medications Prior to Admission  Medication Sig Dispense Refill  . acetaminophen (TYLENOL) 500 MG tablet Take 500 mg by mouth every 6 (six) hours as needed for mild pain, moderate pain, fever or headache.     . Calcium-Vitamin D-Vitamin K 500-100-40 MG-UNT-MCG CHEW Chew 1 each by mouth daily.    . Multiple Vitamin (MULTIVITAMIN) tablet Take 1 tablet by mouth daily.    . predniSONE (DELTASONE) 20  MG tablet Take 3 tablets (60 mg total) by mouth daily with breakfast. 60 tablet 0  . feeding supplement (BOOST / RESOURCE BREEZE) LIQD Take 1 Container by mouth 2 (two) times daily between meals. (Patient not taking: Reported on 07/26/2015) 1 Container 0    Review of Systems: GENERAL:  Feels "ok".  No fever, chills, or weight loss. PERFORMANCE STATUS (ECOG):  1 HEENT:  No visual changes, runny nose, sore throat, mouth sores or tenderness. Lungs:  No shortness of breath or cough.  No hemoptysis. Cardiac:  No chest pain, palpitations, orthopnea, or PND. GI:  No nausea, vomiting, diarrhea, constipation, melena or hematochezia. GU:  No urgency, frequency, dysuria, or hematuria. Musculoskeletal:  Cramps and pain in legs.  No joint pain.  No muscle tenderness. Extremities:  No pain or swelling. Skin:  Petechiae on legs.  Few bruises.  No rashes or skin changes. Neuro:  No headache, numbness or weakness, balance or coordination issues. Endocrine:  No diabetes, thyroid issues, hot flashes or night sweats. Psych:  No mood changes, depression or anxiety. Pain:  No focal pain. Review of systems:  All other systems reviewed and found to be negative.  Physical Exam:  Blood pressure 147/79, pulse 70, temperature 97.8 F (36.6 C), temperature source Oral, resp. rate 18, height '5\' 7"'  (1.702 m), weight 136 lb 1.6 oz (61.735 kg), SpO2 100 %.  GENERAL:  Thin woman sitting comfortably on the medical unit in no acute distress. MENTAL STATUS:  Alert and oriented to person, place and time. HEAD:  Long brown curly hair.  Normocephalic, atraumatic, face symmetric, no Cushingoid features. EYES:  Brown eyes.  Pupils equal round and reactive to light and accomodation.  No conjunctivitis or scleral icterus. ENT:  Oropharynx clear without lesion.  No palatal petechiae.  Tongue normal. Mucous membranes moist.  RESPIRATORY:  Clear to auscultation without rales, wheezes or rhonchi. CARDIOVASCULAR:  Regular rate and  rhythm without murmur, rub or gallop. ABDOMEN:  Soft, non-tender, with active bowel sounds, and no hepatosplenomegaly.  No masses. SKIN:  Lower extremity petechiae.  Small bruises lower extremities.  No rashes, ulcers or lesions. EXTREMITIES: Lower extremity muscle slightly tender to touch without edema or erythema.  No palpable cords. LYMPH NODES: No palpable cervical, supraclavicular, axillary or inguinal adenopathy  NEUROLOGICAL: Unremarkable. PSYCH:  Appropriate.  Results for orders placed or performed during the hospital encounter of 07/26/15 (from the past 48 hour(s))  CBC     Status: Abnormal   Collection Time: 07/26/15  5:50 PM  Result Value Ref Range   WBC 8.5 3.6 - 11.0 K/uL   RBC 4.16 3.80 - 5.20 MIL/uL   Hemoglobin 12.4 12.0 - 16.0 g/dL   HCT 37.5 35.0 - 47.0 %   MCV 90.2 80.0 - 100.0 fL   MCH 29.8 26.0 - 34.0 pg   MCHC 33.0 32.0 -  36.0 g/dL   RDW 14.7 (H) 11.5 - 14.5 %   Platelets 5 (LL) 150 - 440 K/uL    Comment: PLATELET COUNT CONFIRMED BY SMEAR RESULT REPEATED AND VERIFIED CRITICAL RESULT CALLED TO, READ BACK BY AND VERIFIED WITH: JENNIFER WHITLEY ON 07/26/15 AT 659PM BY TB.   Basic metabolic panel     Status: Abnormal   Collection Time: 07/26/15  5:50 PM  Result Value Ref Range   Sodium 137 135 - 145 mmol/L   Potassium 4.4 3.5 - 5.1 mmol/L   Chloride 105 101 - 111 mmol/L   CO2 22 22 - 32 mmol/L   Glucose, Bld 152 (H) 65 - 99 mg/dL   BUN 25 (H) 6 - 20 mg/dL   Creatinine, Ser 0.64 0.44 - 1.00 mg/dL   Calcium 9.5 8.9 - 10.3 mg/dL   GFR calc non Af Amer >60 >60 mL/min   GFR calc Af Amer >60 >60 mL/min    Comment: (NOTE) The eGFR has been calculated using the CKD EPI equation. This calculation has not been validated in all clinical situations. eGFR's persistently <60 mL/min signify possible Chronic Kidney Disease.    Anion gap 10 5 - 15  Differential     Status: Abnormal   Collection Time: 07/26/15  5:50 PM  Result Value Ref Range   Neutro Abs 7.2 (H) 1.4 -  6.5 K/uL   Lymphs Abs 0.9 (L) 1.0 - 3.6 K/uL   Monocytes Absolute 0.2 0.2 - 0.9 K/uL   Eosinophils Absolute 0.0 0 - 0.7 K/uL   Basophils Absolute 0.0 0 - 0.1 K/uL   Neutrophils Relative % 86 %   Lymphocytes Relative 11 %   Monocytes Relative 3 %   Eosinophils Relative 0 %   Basophils Relative 0 %   Band Neutrophils 0 %   Metamyelocytes Relative 0 %   Myelocytes 0 %   Promyelocytes Absolute 0 %   Blasts 0 %   nRBC 0 0 /100 WBC   Other 0 %   RBC Morphology MIXED RBC POPULATION    Smear Review LARGE PLATELETS PRESENT   Protime-INR     Status: None   Collection Time: 07/26/15  5:50 PM  Result Value Ref Range   Prothrombin Time 13.3 11.4 - 15.0 seconds   INR 0.99   APTT     Status: Abnormal   Collection Time: 07/26/15  5:50 PM  Result Value Ref Range   aPTT 146 (H) 24 - 36 seconds    Comment:        IF BASELINE aPTT IS ELEVATED, SUGGEST PATIENT RISK ASSESSMENT BE USED TO DETERMINE APPROPRIATE ANTICOAGULANT THERAPY.   CBC     Status: Abnormal   Collection Time: 07/27/15  5:04 AM  Result Value Ref Range   WBC 7.3 3.6 - 11.0 K/uL   RBC 3.82 3.80 - 5.20 MIL/uL   Hemoglobin 11.2 (L) 12.0 - 16.0 g/dL   HCT 34.3 (L) 35.0 - 47.0 %   MCV 89.8 80.0 - 100.0 fL   MCH 29.3 26.0 - 34.0 pg   MCHC 32.6 32.0 - 36.0 g/dL   RDW 14.6 (H) 11.5 - 14.5 %   Platelets <5 (LL) 150 - 440 K/uL    Comment: CRITICAL VALUE NOTED.  VALUE IS CONSISTENT WITH PREVIOUSLY REPORTED AND CALLED VALUE. RESULT REPEATED AND VERIFIED PLATELET COUNT CONFIRMED BY SMEAR   Basic metabolic panel     Status: Abnormal   Collection Time: 07/27/15  5:04 AM  Result Value Ref  Range   Sodium 139 135 - 145 mmol/L   Potassium 4.2 3.5 - 5.1 mmol/L   Chloride 106 101 - 111 mmol/L   CO2 29 22 - 32 mmol/L   Glucose, Bld 86 65 - 99 mg/dL   BUN 21 (H) 6 - 20 mg/dL   Creatinine, Ser 0.65 0.44 - 1.00 mg/dL   Calcium 8.9 8.9 - 10.3 mg/dL   GFR calc non Af Amer >60 >60 mL/min   GFR calc Af Amer >60 >60 mL/min    Comment:  (NOTE) The eGFR has been calculated using the CKD EPI equation. This calculation has not been validated in all clinical situations. eGFR's persistently <60 mL/min signify possible Chronic Kidney Disease.    Anion gap 4 (L) 5 - 15  Fibrinogen     Status: None   Collection Time: 07/27/15  5:04 AM  Result Value Ref Range   Fibrinogen 303 210 - 470 mg/dL  Magnesium     Status: None   Collection Time: 07/27/15  5:04 AM  Result Value Ref Range   Magnesium 1.9 1.7 - 2.4 mg/dL  Type and screen [facility]     Status: None   Collection Time: 07/27/15  5:13 AM  Result Value Ref Range   ABO/RH(D) O POS    Antibody Screen NEG    Sample Expiration 07/30/2015    US Venous Img Lower Bilateral  07/26/2015  CLINICAL DATA:  54 year old female with bilateral leg pain and tenderness EXAM: BILATERAL LOWER EXTREMITY VENOUS DOPPLER ULTRASOUND TECHNIQUE: Gray-scale sonography with graded compression, as well as color Doppler and duplex ultrasound were performed to evaluate the lower extremity deep venous systems from the level of the common femoral vein and including the common femoral, femoral, profunda femoral, popliteal and calf veins including the posterior tibial, peroneal and gastrocnemius veins when visible. The superficial great saphenous vein was also interrogated. Spectral Doppler was utilized to evaluate flow at rest and with distal augmentation maneuvers in the common femoral, femoral and popliteal veins. COMPARISON:  None. FINDINGS: RIGHT LOWER EXTREMITY Common Femoral Vein: No evidence of thrombus. Normal compressibility, respiratory phasicity and response to augmentation. Saphenofemoral Junction: No evidence of thrombus. Normal compressibility and flow on color Doppler imaging. Profunda Femoral Vein: No evidence of thrombus. Normal compressibility and flow on color Doppler imaging. Femoral Vein: No evidence of thrombus. Normal compressibility, respiratory phasicity and response to  augmentation. Popliteal Vein: No evidence of thrombus. Normal compressibility, respiratory phasicity and response to augmentation. Calf Veins: No evidence of thrombus. Normal compressibility and flow on color Doppler imaging. Superficial Great Saphenous Vein: No evidence of thrombus. Normal compressibility and flow on color Doppler imaging. LEFT LOWER EXTREMITY Common Femoral Vein: No evidence of thrombus. Normal compressibility, respiratory phasicity and response to augmentation. Saphenofemoral Junction: No evidence of thrombus. Normal compressibility and flow on color Doppler imaging. Profunda Femoral Vein: No evidence of thrombus. Normal compressibility and flow on color Doppler imaging. Femoral Vein: No evidence of thrombus. Normal compressibility, respiratory phasicity and response to augmentation. Popliteal Vein: No evidence of thrombus. Normal compressibility, respiratory phasicity and response to augmentation. Calf Veins: No evidence of thrombus. Normal compressibility and flow on color Doppler imaging. Superficial Great Saphenous Vein: No evidence of thrombus. Normal compressibility and flow on color Doppler imaging. IMPRESSION: No evidence of deep venous thrombosis. Electronically Signed   By: Anner Crete M.D.   On: 07/26/2015 21:18    Assessment:  The patient is a 54 y.o. woman with lupus and immune mediated  thrombocytopenic purpura (ITP). She presented with a platelet count of 5,000 on 04/07/2015. She was treated successfully with steroids.  She completed her steroid taper on 06/20/2015. Platelet count at discontinuation of steroids was 133,000.  Initial work-up in 03/2015 included the following normal labs: PT/INR, fibrinogen, B12, folate, TSH , HIV screen, hepatitis C antibody, and H. pylori serologies . Hepatitis B core antibody total and hepatitis B surface antigen were negative. Hepatitis B surface antibody was positive consistent with immunity. Immunoglobulin levels revealed an IgG  of 2894, IgA 156, and IgM 123. Lupus anticoagulant panel was negative. PTT corrected with mix (? factor deficiency VIII, IX, XI, XII, HMWK, prekallikrein). LDH was 217 (98-192). Uric acid was 3.0.  She has recurrent severe immune mediated thrombocytopenia.  She was started on prednisone on 07/25/2015.  Platelet count is < 5,000.  She has lower extremity petechiae and few bruises.  She has no bleeding or mucosal bleeding.  She has lower extremity pain (? myalgias)  Plan:   1.  Discuss recurrent ITP.  With platelet count dropping on steroids, increase steroids from prednisone 60 mg a day to solumedrol 60 mg IV q 12 hours.  Discussed with patient addition of IVIG 0.5 gm/kg x 2-4 days (total dose 1 -2 gm/kg).   2.  Discuss evaluation of elevated PTT, initially thought secondary to lupus anticoagulant, but not documented at last admission.  Labs ordered:  PTT with mix, repeat lupus anticoagulant panel, anticardiolipin antibodies.  If PTT corrects with mix will perform factor studies. 3.  Discuss inpatient stay until platelets > 20,000 followed by outpatient steroid taper. 4.  Solumedrol 1 mg/kg (60 mg) IV q 12 hours.  5.  Omeprazole for GI prophylaxis. 6.  If any significant bleeding:  Solumedrol 1 gm IV then daily x 2, IVIG 1 gm/kg with repeat 24 hours later, platelet transfusion, and consideration of splenectomy.  Discussed with surgeon. 7.  If platelet transfusion given, check 1 hour post platelet count.   Thank you for allowing me to participate in SHONDA MANDARINO 's care.  I will follow /her closely with you while hospitalized and after discharge in the outpatient department.   Lequita Asal, MD  07/27/2015, 1:36 PM

## 2015-07-28 DIAGNOSIS — D696 Thrombocytopenia, unspecified: Secondary | ICD-10-CM

## 2015-07-28 DIAGNOSIS — M79669 Pain in unspecified lower leg: Secondary | ICD-10-CM

## 2015-07-28 DIAGNOSIS — M329 Systemic lupus erythematosus, unspecified: Secondary | ICD-10-CM

## 2015-07-28 DIAGNOSIS — D693 Immune thrombocytopenic purpura: Principal | ICD-10-CM

## 2015-07-28 LAB — CBC
HCT: 33.5 % — ABNORMAL LOW (ref 35.0–47.0)
Hemoglobin: 11.1 g/dL — ABNORMAL LOW (ref 12.0–16.0)
MCH: 29.4 pg (ref 26.0–34.0)
MCHC: 33 g/dL (ref 32.0–36.0)
MCV: 89.2 fL (ref 80.0–100.0)
Platelets: 11 K/uL — CL (ref 150–440)
RBC: 3.76 MIL/uL — ABNORMAL LOW (ref 3.80–5.20)
RDW: 14.3 % (ref 11.5–14.5)
WBC: 10.7 K/uL (ref 3.6–11.0)

## 2015-07-28 NOTE — Progress Notes (Signed)
Dana [name] at [place] NAME: Karie Bridgman    MR#:  [id number]  DATE OF BIRTH:  [birth year]  SUBJECTIVE:   Patient was started on IVIG yesterday. Leg pain has improved but she had some cramps during the night in which she received morphine  REVIEW OF SYSTEMS:    Review of Systems  Constitutional: Negative for fever, chills and malaise/fatigue.  HENT: Negative for sore throat.   Eyes: Negative for blurred vision.  Respiratory: Negative for cough, hemoptysis, shortness of breath and wheezing.   Cardiovascular: Negative for chest pain, palpitations and leg swelling.  Gastrointestinal: Negative for nausea, vomiting, abdominal pain, diarrhea and blood in stool.  Genitourinary: Negative for dysuria.  Musculoskeletal: Negative for back pain.       Leg pain  Skin:       Petechiae  Neurological: Negative for dizziness, tremors and headaches.  Endo/Heme/Allergies: Bruises/bleeds easily.    Tolerating Diet: Yes      DRUG ALLERGIES:   Allergies  Allergen Reactions  . Aspirin     Stomach issue  . Latex Swelling    Patient said she can use latex gloves now.  . Protonix [Pantoprazole Sodium] Other (See Comments)    Cramping in lower legs     VITALS:  Blood pressure 153/82, pulse 61, temperature 97.9 F (36.6 C), temperature source Oral, resp. rate 18, height 5\' 7"  (1.702 m), weight 61.735 kg (136 lb 1.6 oz), SpO2 99 %.  PHYSICAL EXAMINATION:   Physical Exam  Constitutional: She is oriented to person, place, and time and well-developed, well-nourished, and in no distress. No distress.  HENT:  Head: Normocephalic.  Eyes: No scleral icterus.  Neck: Normal range of motion. Neck supple. No JVD present. No tracheal deviation present.  Cardiovascular: Normal rate, regular rhythm and normal heart sounds.  Exam reveals no gallop and no friction rub.   No murmur heard. Pulmonary/Chest: Effort normal and breath sounds normal. No  respiratory distress. She has no wheezes. She has no rales. She exhibits no tenderness.  Abdominal: Soft. Bowel sounds are normal. She exhibits no distension and no mass. There is no tenderness. There is no rebound and no guarding.  Musculoskeletal: Normal range of motion. She exhibits no edema.  Neurological: She is alert and oriented to person, place, and time.  Skin: Skin is warm. No rash noted. No erythema.  Lower extremity bilateral petechiae  Psychiatric: Affect and judgment normal.      LABORATORY PANEL:   CBC  Recent Labs Lab 07/28/15 0607  WBC 10.7  HGB 11.1*  HCT 33.5*  PLT 11*   ------------------------------------------------------------------------------------------------------------------  Chemistries   Recent Labs Lab 07/27/15 0504  NA 139  K 4.2  CL 106  CO2 29  GLUCOSE 86  BUN 21*  CREATININE 0.65  CALCIUM 8.9  MG 1.9   ------------------------------------------------------------------------------------------------------------------  Cardiac Enzymes No results for input(s): TROPONINI in the last 168 hours. ------------------------------------------------------------------------------------------------------------------  RADIOLOGY:  US Venous Img Lower Bilateral  07/26/2015  CLINICAL DATA:  54 year old female with bilateral leg pain and tenderness EXAM: BILATERAL LOWER EXTREMITY VENOUS DOPPLER ULTRASOUND TECHNIQUE: Gray-scale sonography with graded compression, as well as color Doppler and duplex ultrasound were performed to evaluate the lower extremity deep venous systems from the level of the common femoral vein and including the common femoral, femoral, profunda femoral, popliteal and calf veins including the posterior tibial, peroneal and gastrocnemius veins when visible. The superficial great saphenous vein was also interrogated. Spectral Doppler was utilized  to evaluate flow at rest and with distal augmentation maneuvers in the common femoral,  femoral and popliteal veins. COMPARISON:  None. FINDINGS: RIGHT LOWER EXTREMITY Common Femoral Vein: No evidence of thrombus. Normal compressibility, respiratory phasicity and response to augmentation. Saphenofemoral Junction: No evidence of thrombus. Normal compressibility and flow on color Doppler imaging. Profunda Femoral Vein: No evidence of thrombus. Normal compressibility and flow on color Doppler imaging. Femoral Vein: No evidence of thrombus. Normal compressibility, respiratory phasicity and response to augmentation. Popliteal Vein: No evidence of thrombus. Normal compressibility, respiratory phasicity and response to augmentation. Calf Veins: No evidence of thrombus. Normal compressibility and flow on color Doppler imaging. Superficial Great Saphenous Vein: No evidence of thrombus. Normal compressibility and flow on color Doppler imaging. LEFT LOWER EXTREMITY Common Femoral Vein: No evidence of thrombus. Normal compressibility, respiratory phasicity and response to augmentation. Saphenofemoral Junction: No evidence of thrombus. Normal compressibility and flow on color Doppler imaging. Profunda Femoral Vein: No evidence of thrombus. Normal compressibility and flow on color Doppler imaging. Femoral Vein: No evidence of thrombus. Normal compressibility, respiratory phasicity and response to augmentation. Popliteal Vein: No evidence of thrombus. Normal compressibility, respiratory phasicity and response to augmentation. Calf Veins: No evidence of thrombus. Normal compressibility and flow on color Doppler imaging. Superficial Great Saphenous Vein: No evidence of thrombus. Normal compressibility and flow on color Doppler imaging. IMPRESSION: No evidence of deep venous thrombosis. Electronically Signed   By: Anner Crete M.D.   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  1. ITP: Patient will continue  high-dose IV steroids and IVIG. Oncology consult appreciated. Patient will  need this combination until platelet count is greater than 20,000..  2. Bilateral leg pain: I suspect this could be due to her ITP and low platelet count. Dopplers are negative for DVT.  3. Disseminated lupus: She is not currently on any medications at this time.    Management plans discussed with the patient and she is in agreement.  CODE STATUS: FULL  TOTAL TIME TAKING CARE OF THIS PATIENT: 10minutes.     POSSIBLE D/C 2-3 days, DEPENDING ON CLINICAL CONDITION.   Idalys Konecny M.D on 07/28/2015 at 9:09 AM  Between 7am to 6pm - Pager - [phone] After 6pm go to [email or web address] - password [email or web address] [facility]  Office  [phone]  CC: Primary care physician; Pcp Not In System  Note: This dictation was prepared with Dragon dictation along with smaller phrase technology. Any transcriptional errors that result from this process are unintentional.

## 2015-07-28 NOTE — Plan of Care (Signed)
Problem: Education: Goal: Knowledge of [facility] Education information/materials will improve Outcome: Progressing Patient finished IVIG infusion this shift.  Patient educated on the next step (morning labs) and the possible need for a second infusion tomorrow.  Patient tolerated infusion well.   Problem: Pain Managment: Goal: General experience of comfort will improve Outcome: Progressing Patient with complaints of cramping to feet and lower legs.  Patient given Morphine and Tylenol during shift.  Pain had diminished during reassessment and patient was able to rest comfortably.

## 2015-07-28 NOTE — Progress Notes (Signed)
[facility]  Date of admission:  07/27/2015  Inpatient day:  07/28/2015  Consulting physician: Dana Costa, MD  Chief Complaint: Dana [name] is a 54 y.o. female with lupus and a history of ITP who was admitted from the emergency room with thrombocytopenia and lower extremity pain.  HPI: The patient states that she was diagnosed with lupus in 2001. She initially presented with flulike symptoms, fever and joint aches. She was seen by a rheumatologist in [place] as well as one at the [facility] [place]. She was treated with Plaquenil and prednisone for 4 years. Her medications were eventually tapered off. She has not been seen for her lupus in 3 years. She denies any prior history of renal or blood issues associated with her lupus.  She is scheduled to see Dr. Kathyrn [name] at [facility].  She was last admitted to [facility] on 04/07/2015 with a platelet count of 5,000.  Two weeks prior,  she had diarrhea, a low grade fever, and a slight sore throat. Work-up included the following normal labs: PT/INR, fibrinogen, B12, folate, TSH , HIV screen, hepatitis C antibody, and H. pylori serologies . Hepatitis B core antibody total and hepatitis B surface antigen were negative. Hepatitis B surface antibody was positive consistent with immunity. Immunoglobulin levels revealed an IgG of 2894, IgA 156, and IgM 123. Lupus anticoagulant panel was negative. PTT corrected with mix (? factor deficiency VIII, IX, XI, XII, HMWK, prekallikrein). LDH was 217 (98-192). Uric acid was 3.0.  She was started on solumedrol 60 mg IV q 12 hours.  Platelet count was 6,000 on 04/08/2015, 7,000 on 04/09/2015, 10,000 on 04/09/2015, and 26,000 on 04/10/2015.  Shewas discharged on 04/10/2015 on prednisone 60 mg a day.  Her platelet count was 110,000 on 04/25/2015 and 250,000 on 05/02/2015.  She was on a slow steroid taper until 06/20/2015. Platelet count at discontinuation of steroids was 133,000.  She  presented to clinic on 07/25/2015 for 1 month month follow-up visit.  She noted a 2 day history of leg cramps and a 1 day history of lower extremity petechiae. She had transient epistaxis 2 days prior. Exam revealed lower extremity petechiae.  She was started on prednisone 60 mg a day.  On 07/26/2015, she noted worsening lower extremity pain.  She denied any trauma, swelling or bleeding.   ER evaluation with lower extremity duplex revealed no evidence of DVT.  She denied any bleeding.  Platelet count was 5,000.  She was admitted for pain management and treatment of her ITP.  Past Medical History  Diagnosis Date  . Lupus ([place])   . Uterine fibroid   . Hemorrhoid   . ITP (idiopathic thrombocytopenic purpura)     Past Surgical History  Procedure Laterality Date  . Pelvic laparoscopy    . Carpal tunnel release      bilateral    Family History  Problem Relation Age of Onset  . Hypertension Mother   . Cancer Father     porstate  . Diabetes Father   . 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 She has been able to sleep well despite the steroids. She received first dose of IVIG yesterday, and has been able to tolerate that well. She denies any new bleeding or bruising, she continues to have petechiae and a few ecchymosis on the lower extremities. She lost her teeth yesterday, which caused mild gingival bleeding.  Social History:  reports that  she has never smoked. She does not have any smokeless tobacco history on file. She reports that she does not drink alcohol or use illicit drugs.  She is a Pharmacist, community.  She is from [place].  She has lived in [place] for 9 years.  She is accompanied by her sister.    Allergies:  Allergies  Allergen Reactions  . Aspirin     Stomach issue  . Latex Swelling    Patient said she can use latex gloves now.  . Protonix [Pantoprazole Sodium] Other (See Comments)    Cramping in lower legs      Medications Prior to Admission  Medication Sig Dispense Refill  . acetaminophen (TYLENOL) 500 MG tablet Take 500 mg by mouth every 6 (six) hours as needed for mild pain, moderate pain, fever or headache.     . Calcium-Vitamin D-Vitamin K 500-100-40 MG-UNT-MCG CHEW Chew 1 each by mouth daily.    . Multiple Vitamin (MULTIVITAMIN) tablet Take 1 tablet by mouth daily.    . predniSONE (DELTASONE) 20 MG tablet Take 3 tablets (60 mg total) by mouth daily with breakfast. 60 tablet 0  . feeding supplement (BOOST / RESOURCE BREEZE) LIQD Take 1 Container by mouth 2 (two) times daily between meals. (Patient not taking: Reported on 07/26/2015) 1 Container 0    Review of Systems: GENERAL:  Feels "ok".  No fever, chills, or weight loss. PERFORMANCE STATUS (ECOG):  1 HEENT:  No visual changes, runny nose, sore throat, mouth sores or tenderness. Lungs:  No shortness of breath or cough.  No hemoptysis. Cardiac:  No chest pain, palpitations, orthopnea, or PND. GI:  No nausea, vomiting, diarrhea, constipation, melena or hematochezia. GU:  No urgency, frequency, dysuria, or hematuria. Musculoskeletal:  Cramps and pain in legs.  No joint pain.  No muscle tenderness. Extremities:  No pain or swelling. Skin:  Petechiae on legs.  Few bruises.  No rashes or skin changes. Neuro:  No headache, numbness or weakness, balance or coordination issues. Endocrine:  No diabetes, thyroid issues, hot flashes or night sweats. Psych:  No mood changes, depression or anxiety. Pain:  No focal pain. Review of systems:  All other systems reviewed and found to be negative.  Physical Exam:  Blood pressure 153/82, pulse 61, temperature 97.9 F (36.6 C), temperature source Oral, resp. rate 18, height '5\' 7"'  (1.702 m), weight 136 lb 1.6 oz (61.735 kg), SpO2 99 %.  GENERAL:  Thin African-American woman sitting comfortably on the medical unit in no acute distress. MENTAL STATUS:  Alert and oriented to person, place and time. HEAD:   Long brown curly hair.  Normocephalic, atraumatic, face symmetric, no Cushingoid features. EYES:  Brown eyes.  Pupils equal round and reactive to light and accomodation.  No conjunctivitis or scleral icterus. ENT:  Oropharynx clear without lesion.  No palatal petechiae.  Tongue normal. Mucous membranes moist.  RESPIRATORY:  Clear to auscultation without rales, wheezes or rhonchi. CARDIOVASCULAR:  Regular rate and rhythm without murmur, rub or gallop. ABDOMEN:  Soft, non-tender, with active bowel sounds, and no hepatosplenomegaly.  No masses. SKIN:  Lower extremity petechiae.  Small ecchymosis and multiple petechiae on both lower extremities.  No rashes, ulcers or lesions. Few ecchymosis on upper extremities at the sites of IV injections and blood draws. EXTREMITIES: Lower extremity muscle slightly tender to touch without edema or erythema.  No palpable cords. LYMPH NODES: No palpable cervical, supraclavicular, axillary or inguinal adenopathy  NEUROLOGICAL: Unremarkable. PSYCH:  Appropriate.  Results for orders  placed or performed during the hospital encounter of 07/26/15 (from the past 48 hour(s))  CBC     Status: Abnormal   Collection Time: 07/26/15  5:50 PM  Result Value Ref Range   WBC 8.5 3.6 - 11.0 K/uL   RBC 4.16 3.80 - 5.20 MIL/uL   Hemoglobin 12.4 12.0 - 16.0 g/dL   HCT 37.5 35.0 - 47.0 %   MCV 90.2 80.0 - 100.0 fL   MCH 29.8 26.0 - 34.0 pg   MCHC 33.0 32.0 - 36.0 g/dL   RDW 14.7 (H) 11.5 - 14.5 %   Platelets 5 (LL) 150 - 440 K/uL    Comment: PLATELET COUNT CONFIRMED BY SMEAR RESULT REPEATED AND VERIFIED CRITICAL RESULT CALLED TO, READ BACK BY AND VERIFIED WITH: JENNIFER WHITLEY ON 07/26/15 AT 659PM BY TB.   Basic metabolic panel     Status: Abnormal   Collection Time: 07/26/15  5:50 PM  Result Value Ref Range   Sodium 137 135 - 145 mmol/L   Potassium 4.4 3.5 - 5.1 mmol/L   Chloride 105 101 - 111 mmol/L   CO2 22 22 - 32 mmol/L   Glucose, Bld 152 (H) 65 - 99 mg/dL   BUN 25  (H) 6 - 20 mg/dL   Creatinine, Ser 0.64 0.44 - 1.00 mg/dL   Calcium 9.5 8.9 - 10.3 mg/dL   GFR calc non Af Amer >60 >60 mL/min   GFR calc Af Amer >60 >60 mL/min    Comment: (NOTE) The eGFR has been calculated using the CKD EPI equation. This calculation has not been validated in all clinical situations. eGFR's persistently <60 mL/min signify possible Chronic Kidney Disease.    Anion gap 10 5 - 15  Differential     Status: Abnormal   Collection Time: 07/26/15  5:50 PM  Result Value Ref Range   Neutro Abs 7.2 (H) 1.4 - 6.5 K/uL   Lymphs Abs 0.9 (L) 1.0 - 3.6 K/uL   Monocytes Absolute 0.2 0.2 - 0.9 K/uL   Eosinophils Absolute 0.0 0 - 0.7 K/uL   Basophils Absolute 0.0 0 - 0.1 K/uL   Neutrophils Relative % 86 %   Lymphocytes Relative 11 %   Monocytes Relative 3 %   Eosinophils Relative 0 %   Basophils Relative 0 %   Band Neutrophils 0 %   Metamyelocytes Relative 0 %   Myelocytes 0 %   Promyelocytes Absolute 0 %   Blasts 0 %   nRBC 0 0 /100 WBC   Other 0 %   RBC Morphology MIXED RBC POPULATION    Smear Review LARGE PLATELETS PRESENT   Protime-INR     Status: None   Collection Time: 07/26/15  5:50 PM  Result Value Ref Range   Prothrombin Time 13.3 11.4 - 15.0 seconds   INR 0.99   APTT     Status: Abnormal   Collection Time: 07/26/15  5:50 PM  Result Value Ref Range   aPTT 146 (H) 24 - 36 seconds    Comment:        IF BASELINE aPTT IS ELEVATED, SUGGEST PATIENT RISK ASSESSMENT BE USED TO DETERMINE APPROPRIATE ANTICOAGULANT THERAPY.   CBC     Status: Abnormal   Collection Time: 07/27/15  5:04 AM  Result Value Ref Range   WBC 7.3 3.6 - 11.0 K/uL   RBC 3.82 3.80 - 5.20 MIL/uL   Hemoglobin 11.2 (L) 12.0 - 16.0 g/dL   HCT 34.3 (L) 35.0 - 47.0 %  MCV 89.8 80.0 - 100.0 fL   MCH 29.3 26.0 - 34.0 pg   MCHC 32.6 32.0 - 36.0 g/dL   RDW 14.6 (H) 11.5 - 14.5 %   Platelets <5 (LL) 150 - 440 K/uL    Comment: CRITICAL VALUE NOTED.  VALUE IS CONSISTENT WITH PREVIOUSLY REPORTED AND  CALLED VALUE. RESULT REPEATED AND VERIFIED PLATELET COUNT CONFIRMED BY SMEAR   Basic metabolic panel     Status: Abnormal   Collection Time: 07/27/15  5:04 AM  Result Value Ref Range   Sodium 139 135 - 145 mmol/L   Potassium 4.2 3.5 - 5.1 mmol/L   Chloride 106 101 - 111 mmol/L   CO2 29 22 - 32 mmol/L   Glucose, Bld 86 65 - 99 mg/dL   BUN 21 (H) 6 - 20 mg/dL   Creatinine, Ser 0.65 0.44 - 1.00 mg/dL   Calcium 8.9 8.9 - 10.3 mg/dL   GFR calc non Af Amer >60 >60 mL/min   GFR calc Af Amer >60 >60 mL/min    Comment: (NOTE) The eGFR has been calculated using the CKD EPI equation. This calculation has not been validated in all clinical situations. eGFR's persistently <60 mL/min signify possible Chronic Kidney Disease.    Anion gap 4 (L) 5 - 15  Fibrinogen     Status: None   Collection Time: 07/27/15  5:04 AM  Result Value Ref Range   Fibrinogen 303 210 - 470 mg/dL  Magnesium     Status: None   Collection Time: 07/27/15  5:04 AM  Result Value Ref Range   Magnesium 1.9 1.7 - 2.4 mg/dL  Type and screen [place]     Status: None   Collection Time: 07/27/15  5:13 AM  Result Value Ref Range   ABO/RH(D) O POS    Antibody Screen NEG    Sample Expiration 07/30/2015   CBC     Status: Abnormal   Collection Time: 07/28/15  6:07 AM  Result Value Ref Range   WBC 10.7 3.6 - 11.0 K/uL   RBC 3.76 (L) 3.80 - 5.20 MIL/uL   Hemoglobin 11.1 (L) 12.0 - 16.0 g/dL   HCT 33.5 (L) 35.0 - 47.0 %   MCV 89.2 80.0 - 100.0 fL   MCH 29.4 26.0 - 34.0 pg   MCHC 33.0 32.0 - 36.0 g/dL   RDW 14.3 11.5 - 14.5 %   Platelets 11 (LL) 150 - 440 K/uL    Comment: PLATELET COUNT CONFIRMED BY SMEAR CRITICAL RESULT CALLED TO, READ BACK BY AND VERIFIED WITH: SHAYE BREWER ON 07/28/15 AT 4431 BY QSD LARGE PLATELETS PRESENT    US Venous Img Lower Bilateral  07/26/2015  CLINICAL DATA:  54 year old female with bilateral leg pain and tenderness EXAM: BILATERAL LOWER EXTREMITY VENOUS DOPPLER  ULTRASOUND TECHNIQUE: Gray-scale sonography with graded compression, as well as color Doppler and duplex ultrasound were performed to evaluate the lower extremity deep venous systems from the level of the common femoral vein and including the common femoral, femoral, profunda femoral, popliteal and calf veins including the posterior tibial, peroneal and gastrocnemius veins when visible. The superficial great saphenous vein was also interrogated. Spectral Doppler was utilized to evaluate flow at rest and with distal augmentation maneuvers in the common femoral, femoral and popliteal veins. COMPARISON:  None. FINDINGS: RIGHT LOWER EXTREMITY Common Femoral Vein: No evidence of thrombus. Normal compressibility, respiratory phasicity and response to augmentation. Saphenofemoral Junction: No evidence of thrombus. Normal compressibility and flow on color Doppler  imaging. Profunda Femoral Vein: No evidence of thrombus. Normal compressibility and flow on color Doppler imaging. Femoral Vein: No evidence of thrombus. Normal compressibility, respiratory phasicity and response to augmentation. Popliteal Vein: No evidence of thrombus. Normal compressibility, respiratory phasicity and response to augmentation. Calf Veins: No evidence of thrombus. Normal compressibility and flow on color Doppler imaging. Superficial Great Saphenous Vein: No evidence of thrombus. Normal compressibility and flow on color Doppler imaging. LEFT LOWER EXTREMITY Common Femoral Vein: No evidence of thrombus. Normal compressibility, respiratory phasicity and response to augmentation. Saphenofemoral Junction: No evidence of thrombus. Normal compressibility and flow on color Doppler imaging. Profunda Femoral Vein: No evidence of thrombus. Normal compressibility and flow on color Doppler imaging. Femoral Vein: No evidence of thrombus. Normal compressibility, respiratory phasicity and response to augmentation. Popliteal Vein: No evidence of thrombus. Normal  compressibility, respiratory phasicity and response to augmentation. Calf Veins: No evidence of thrombus. Normal compressibility and flow on color Doppler imaging. Superficial Great Saphenous Vein: No evidence of thrombus. Normal compressibility and flow on color Doppler imaging. IMPRESSION: No evidence of deep venous thrombosis. Electronically Signed   By: Anner Crete M.D.   On: 07/26/2015 21:18    Assessment:  The patient is a 54 y.o. woman with lupus and immune mediated thrombocytopenic purpura (ITP). She presented with a platelet count of 5,000 on 04/07/2015. She was treated successfully with steroids.  She completed her steroid taper on 06/20/2015. Platelet count at discontinuation of steroids was 133,000.  Initial work-up in 03/2015 included the following normal labs: PT/INR, fibrinogen, B12, folate, TSH , HIV screen, hepatitis C antibody, and H. pylori serologies . Hepatitis B core antibody total and hepatitis B surface antigen were negative. Hepatitis B surface antibody was positive consistent with immunity. Immunoglobulin levels revealed an IgG of 2894, IgA 156, and IgM 123. Lupus anticoagulant panel was negative. PTT corrected with mix (? factor deficiency VIII, IX, XI, XII, HMWK, prekallikrein). LDH was 217 (98-192). Uric acid was 3.0.  She has recurrent severe immune mediated thrombocytopenia.  She was started on prednisone on 07/25/2015.  Platelet count is < 5,000.  She has lower extremity petechiae and few bruises.  She has no bleeding or mucosal bleeding.  She has lower extremity pain (? myalgias)  Plan:   1.  Discuss recurrent ITP- I reviewed the results of CBC from this morning, and it appears that her platelet count has improved to 11,000 cells per microliter. We should expect further improvement due to IVIG over the next 24-48 hours.   2.  Discuss evaluation of elevated PTT, initially thought secondary to lupus anticoagulant, but not documented at last admission.  Labs  ordered:  PTT with mix, repeat lupus anticoagulant panel, anticardiolipin antibodies.  If PTT corrects with mix will perform factor studies. 3.  Discuss inpatient stay until platelets > 30,000 followed by outpatient steroid taper. 4.  Solumedrol 1 mg/kg (60 mg) IV q 12 hours.  5.  Omeprazole for GI prophylaxis. 6.  If any significant bleeding:  Solumedrol 1 gm IV then daily x 2, IVIG 1 gm/kg with repeat 24 hours later, platelet transfusion, and consideration of splenectomy.  Discussed with surgeon. Surgical consult was appreciated. So far no evidence of significant bleeding 7.  If platelet transfusion given, check 1 hour post platelet count.      Roxana Hires, MD  07/28/2015, 10:48 AM

## 2015-07-29 LAB — CBC
HEMATOCRIT: 33.7 % — AB (ref 35.0–47.0)
HEMOGLOBIN: 11.3 g/dL — AB (ref 12.0–16.0)
MCH: 30.1 pg (ref 26.0–34.0)
MCHC: 33.6 g/dL (ref 32.0–36.0)
MCV: 89.7 fL (ref 80.0–100.0)
Platelets: 37 10*3/uL — ABNORMAL LOW (ref 150–440)
RBC: 3.76 MIL/uL — AB (ref 3.80–5.20)
RDW: 14.6 % — AB (ref 11.5–14.5)
WBC: 10.8 10*3/uL (ref 3.6–11.0)

## 2015-07-29 MED ORDER — RANITIDINE HCL 150 MG PO TABS: 150.0000 mg | ORAL_TABLET | Freq: Two times a day (BID) | ORAL | Status: DC

## 2015-07-29 NOTE — Plan of Care (Signed)
Problem: Education: Goal: Knowledge of [facility] Education information/materials will improve Outcome: Progressing Patient finished today's IVIG infusion at 18:30 before shift. Patient educated on normal range and the plan for her to continue infusions until labs within normal range.      Problem: Pain Managment: Goal: General experience of comfort will improve Outcome: Progressing Patient without complaints of cramping to feet and lower legs this shift. Patient rested comfortably throughout shift.  Patient is low fall risk and is up independently in room.

## 2015-07-29 NOTE — Discharge Summary (Signed)
[facility] NAME: Dana [name]    MR#:  [id number]  DATE OF BIRTH:  [birth year]  DATE OF ADMISSION:  07/26/2015 ADMITTING PHYSICIAN: Lance Coon, MD  DATE OF DISCHARGE: 07/28/2015    PRIMARY CARE PHYSICIAN: Pcp Not In System    ADMISSION DIAGNOSIS:  ITP (idiopathic thrombocytopenic purpura) [D69.3] History of lupus [Z86.2] Bilateral lower extremity pain [M79.604, M79.605]  DISCHARGE DIAGNOSIS:  Principal Problem:   ITP (idiopathic thrombocytopenic purpura) Active Problems:   Disseminated lupus erythematosus (HCC)   Leg pain   Idiopathic thrombocytopenic purpura (ITP)   SECONDARY DIAGNOSIS:   Past Medical History  Diagnosis Date  . Lupus ([place])   . Uterine fibroid   . Hemorrhoid   . ITP (idiopathic thrombocytopenic purpura)     HOSPITAL COURSE:   54 year old female with a known history of ITP who presents with bilateral leg weakness and ITP.  1. ITP: Patient was started on high-dose IV steroids and IVIG. This apparently seem to help as her platelet count is 37,000 this morning. Patient has no signs of bleeding. Patient will be discharged on oral prednisone as prescribed recently by her oncologist. She will be discharged on ranitidine instead of PPI as she has bad cramping with tonic in the past.  2. Bilateral leg pain: I suspect this could be due to her ITP and low platelet count. Dopplers are negative for DVT.  3. Disseminated lupus: She is not currently on any medications at this time.   DISCHARGE CONDITIONS AND DIET:   Discharge home in stable condition on regular diet  CONSULTS OBTAINED:  Treatment Team:  Lequita Asal, MD Florene Glen, MD  DRUG ALLERGIES:   Allergies  Allergen Reactions  . Aspirin     Stomach issue  . Latex Swelling    Patient said she can use latex gloves now.  . Protonix [Pantoprazole Sodium] Other (See Comments)    Cramping in lower legs     DISCHARGE  MEDICATIONS:   Current Discharge Medication List    START taking these medications   Details  ranitidine (ZANTAC) 150 MG tablet Take 1 tablet (150 mg total) by mouth 2 (two) times daily. Qty: 60 tablet, Refills: 0      CONTINUE these medications which have NOT CHANGED   Details  acetaminophen (TYLENOL) 500 MG tablet Take 500 mg by mouth every 6 (six) hours as needed for mild pain, moderate pain, fever or headache.     Calcium-Vitamin D-Vitamin K [id number] MG-UNT-MCG CHEW Chew 1 each by mouth daily.    Multiple Vitamin (MULTIVITAMIN) tablet Take 1 tablet by mouth daily.    predniSONE (DELTASONE) 20 MG tablet Take 3 tablets (60 mg total) by mouth daily with breakfast. Qty: 60 tablet, Refills: 0    feeding supplement (BOOST / RESOURCE BREEZE) LIQD Take 1 Container by mouth 2 (two) times daily between meals. Qty: 1 Container, Refills: 0              Today   CHIEF COMPLAINT:  Patient is doing well this point. Like cramps have improved.   VITAL SIGNS:  Blood pressure 174/78, pulse 61, temperature 97.7 F (36.5 C), temperature source Oral, resp. rate 18, height 5\' 7"  (1.702 m), weight 61.735 kg (136 lb 1.6 oz), SpO2 98 %.   REVIEW OF SYSTEMS:  Review of Systems  Constitutional: Negative for fever, chills and malaise/fatigue.  HENT: Negative for sore throat.   Eyes: Negative for blurred vision.  Respiratory: Negative for cough, hemoptysis, shortness of breath and wheezing.   Cardiovascular: Negative for chest pain, palpitations and leg swelling.  Gastrointestinal: Negative for nausea, vomiting, abdominal pain, diarrhea and blood in stool.  Genitourinary: Negative for dysuria.  Musculoskeletal: Negative for back pain.  Skin:       Petechiae improved  Neurological: Negative for dizziness, tremors and headaches.  Endo/Heme/Allergies: Does not bruise/bleed easily.     PHYSICAL EXAMINATION:  GENERAL:  54 y.o.-year-old patient lying in the bed with no acute distress.   NECK:  Supple, no jugular venous distention. No thyroid enlargement, no tenderness.  LUNGS: Normal breath sounds bilaterally, no wheezing, rales,rhonchi  No use of accessory muscles of respiration.  CARDIOVASCULAR: S1, S2 normal. No murmurs, rubs, or gallops.  ABDOMEN: Soft, non-tender, non-distended. Bowel sounds present. No organomegaly or mass.  EXTREMITIES: No pedal edema, cyanosis, or clubbing.  PSYCHIATRIC: The patient is alert and oriented x 3.  SKIN: Petechiae  DATA REVIEW:   CBC  Recent Labs Lab 07/29/15 0507  WBC 10.8  HGB 11.3*  HCT 33.7*  PLT 37*    Chemistries   Recent Labs Lab 07/27/15 0504  NA 139  K 4.2  CL 106  CO2 29  GLUCOSE 86  BUN 21*  CREATININE 0.65  CALCIUM 8.9  MG 1.9    Cardiac Enzymes No results for input(s): TROPONINI in the last 168 hours.  Microbiology Results  @[email or web address] @  RADIOLOGY:  No results found.    Management plans discussed with the patient and she is in agreement. Stable for discharge   Patient should follow up with oncology in 3 days  CODE STATUS:     Code Status Orders        Start     Ordered   07/26/15 2327  Full code   Continuous     07/26/15 2327    Advance Directive Documentation        Most Recent Value   Type of Advance Directive  Healthcare Power of Attorney   Pre-existing out of facility DNR order (yellow form or pink MOST form)     "MOST" Form in Place?        TOTAL TIME TAKING CARE OF THIS PATIENT: 35 minutes.    Note: This dictation was prepared with Dragon dictation along with smaller phrase technology. Any transcriptional errors that result from this process are unintentional.  Ruari Duggan M.D on 07/29/2015 at 9:51 AM  Between 7am to 6pm - Pager - [phone] After 6pm go to [email or web address] - password [email or web address]  Office  [phone]  CC: Primary care physician; Pcp Not In System

## 2015-07-29 NOTE — Progress Notes (Signed)
Discussed discharge instructions and medications with pt and her sister at bedside.  Pt home meds returned to her from pharmacy.  IV removed.  All questions answered.  Pt transported home via care by her sister.  Clarise Cruz, RN

## 2015-07-29 NOTE — Progress Notes (Signed)
[facility]  Date of admission:  07/27/2015  Inpatient day:  07/29/2015  Consulting physician: Bettey Costa, MD  Chief Complaint: Dana [name] is a 54 y.o. female with lupus and a history of ITP who was admitted from the emergency room with thrombocytopenia and lower extremity pain.  HPI: The patient states that she was diagnosed with lupus in 2001. She initially presented with flulike symptoms, fever and joint aches. She was seen by a rheumatologist in [place] as well as one at the [place] [place]. She was treated with Plaquenil and prednisone for 4 years. Her medications were eventually tapered off. She has not been seen for her lupus in 3 years. She denies any prior history of renal or blood issues associated with her lupus.  She is scheduled to see [name] at [facility].  She was last admitted to [facility] on 04/07/2015 with a platelet count of 5,000.  Two weeks prior,  she had diarrhea, a low grade fever, and a slight sore throat. Work-up included the following normal labs: PT/INR, fibrinogen, B12, folate, TSH , HIV screen, hepatitis C antibody, and H. pylori serologies . Hepatitis B core antibody total and hepatitis B surface antigen were negative. Hepatitis B surface antibody was positive consistent with immunity. Immunoglobulin levels revealed an IgG of 2894, IgA 156, and IgM 123. Lupus anticoagulant panel was negative. PTT corrected with mix (? factor deficiency VIII, IX, XI, XII, HMWK, prekallikrein). LDH was 217 (98-192). Uric acid was 3.0.  She was started on solumedrol 60 mg IV q 12 hours.  Platelet count was 6,000 on 04/08/2015, 7,000 on 04/09/2015, 10,000 on 04/09/2015, and 26,000 on 04/10/2015.  Shewas discharged on 04/10/2015 on prednisone 60 mg a day.  Her platelet count was 110,000 on 04/25/2015 and 250,000 on 05/02/2015.  She was on a slow steroid taper until 06/20/2015. Platelet count at discontinuation of steroids was 133,000.  She  presented to clinic on 07/25/2015 for 1 month month follow-up visit.  She noted a 2 day history of leg cramps and a 1 day history of lower extremity petechiae. She had transient epistaxis 2 days prior. Exam revealed lower extremity petechiae.  She was started on prednisone 60 mg a day.  On 07/26/2015, she noted worsening lower extremity pain.  She denied any trauma, swelling or bleeding.   ER evaluation with lower extremity duplex revealed no evidence of DVT.  She denied any bleeding.  Platelet count was 5,000.  She was admitted for pain management and treatment of her ITP.  Past Medical History  Diagnosis Date  . Lupus ([place])   . Uterine fibroid   . Hemorrhoid   . ITP (idiopathic thrombocytopenic purpura)     Past Surgical History  Procedure Laterality Date  . Pelvic laparoscopy    . Carpal tunnel release      bilateral    Family History  Problem Relation Age of Onset  . Hypertension Mother   . Cancer Father     porstate  . Diabetes Father   . Hypertension Sister    Current status: Dr. [name] has been feeling well over the past 24 hours. She denies any bleeding, pain, nausea, vomiting, hemoptysis, hematochezia. She denies any new petechiae or ecchymosis.  Social History:  reports that she has never smoked. She does not have any smokeless tobacco history on file. She reports that she does not drink alcohol or use illicit drugs.  She is a Pharmacist, community.  She is from [place].  She has  lived in [place] for 9 years.  She is accompanied by her sister.    Allergies:  Allergies  Allergen Reactions  . Aspirin     Stomach issue  . Latex Swelling    Patient said she can use latex gloves now.  . Protonix [Pantoprazole Sodium] Other (See Comments)    Cramping in lower legs     Medications Prior to Admission  Medication Sig Dispense Refill  . acetaminophen (TYLENOL) 500 MG tablet Take 500 mg by mouth every 6 (six) hours as needed for mild pain, moderate pain, fever or headache.      . Calcium-Vitamin D-Vitamin K 500-100-40 MG-UNT-MCG CHEW Chew 1 each by mouth daily.    . Multiple Vitamin (MULTIVITAMIN) tablet Take 1 tablet by mouth daily.    . predniSONE (DELTASONE) 20 MG tablet Take 3 tablets (60 mg total) by mouth daily with breakfast. 60 tablet 0  . feeding supplement (BOOST / RESOURCE BREEZE) LIQD Take 1 Container by mouth 2 (two) times daily between meals. (Patient not taking: Reported on 07/26/2015) 1 Container 0    Review of Systems: GENERAL:  Feels "ok".  No fever, chills, or weight loss. PERFORMANCE STATUS (ECOG):  1 HEENT:  No visual changes, runny nose, sore throat, mouth sores or tenderness. Lungs:  No shortness of breath or cough.  No hemoptysis. Cardiac:  No chest pain, palpitations, orthopnea, or PND. GI:  No nausea, vomiting, diarrhea, constipation, melena or hematochezia. GU:  No urgency, frequency, dysuria, or hematuria. Musculoskeletal:  Cramps and pain in legs.  No joint pain.  No muscle tenderness. Extremities:  No pain or swelling. Skin:  Petechiae on legs.  Few bruises.  No rashes or skin changes. Neuro:  No headache, numbness or weakness, balance or coordination issues. Endocrine:  No diabetes, thyroid issues, hot flashes or night sweats. Psych:  No mood changes, depression or anxiety. Pain:  No focal pain. Review of systems:  All other systems reviewed and found to be negative.  Physical Exam:  Blood pressure 174/78, pulse 61, temperature 97.7 F (36.5 C), temperature source Oral, resp. rate 18, height 5\' 7"  (1.702 m), weight 136 lb 1.6 oz (61.735 kg), SpO2 98 %.  GENERAL:  Thin African-American woman sitting comfortably on the medical unit in no acute distress. MENTAL STATUS:  Alert and oriented to person, place and time. HEAD:  Long brown curly hair.  Normocephalic, atraumatic, face symmetric, no Cushingoid features. EYES:  Brown eyes.  Pupils equal round and reactive to light and accomodation.  No conjunctivitis or scleral icterus. ENT:   Oropharynx clear without lesion.  No palatal petechiae.  Tongue normal. Mucous membranes moist.  RESPIRATORY:  Clear to auscultation without rales, wheezes or rhonchi. CARDIOVASCULAR:  Regular rate and rhythm without murmur, rub or gallop. ABDOMEN:  Soft, non-tender, with active bowel sounds, and no hepatosplenomegaly.  No masses. SKIN:  Lower extremity petechiae.  Small ecchymosis and multiple petechiae on both lower extremities.  No rashes, ulcers or lesions. Few ecchymosis on upper extremities at the sites of IV injections and blood draws. EXTREMITIES: Lower extremity muscle slightly tender to touch without edema or erythema.  No palpable cords. LYMPH NODES: No palpable cervical, supraclavicular, axillary or inguinal adenopathy  NEUROLOGICAL: Unremarkable. PSYCH:  Appropriate.  Results for orders placed or performed during the hospital encounter of 07/26/15 (from the past 48 hour(s))  CBC     Status: Abnormal   Collection Time: 07/28/15  6:07 AM  Result Value Ref Range   WBC 10.7  3.6 - 11.0 K/uL   RBC 3.76 (L) 3.80 - 5.20 MIL/uL   Hemoglobin 11.1 (L) 12.0 - 16.0 g/dL   HCT 33.5 (L) 35.0 - 47.0 %   MCV 89.2 80.0 - 100.0 fL   MCH 29.4 26.0 - 34.0 pg   MCHC 33.0 32.0 - 36.0 g/dL   RDW 14.3 11.5 - 14.5 %   Platelets 11 (LL) 150 - 440 K/uL    Comment: PLATELET COUNT CONFIRMED BY SMEAR CRITICAL RESULT CALLED TO, READ BACK BY AND VERIFIED WITH: SHAYE BREWER ON 07/28/15 AT 0724 BY QSD LARGE PLATELETS PRESENT   CBC     Status: Abnormal   Collection Time: 07/29/15  5:07 AM  Result Value Ref Range   WBC 10.8 3.6 - 11.0 K/uL   RBC 3.76 (L) 3.80 - 5.20 MIL/uL   Hemoglobin 11.3 (L) 12.0 - 16.0 g/dL   HCT 33.7 (L) 35.0 - 47.0 %   MCV 89.7 80.0 - 100.0 fL   MCH 30.1 26.0 - 34.0 pg   MCHC 33.6 32.0 - 36.0 g/dL   RDW 14.6 (H) 11.5 - 14.5 %   Platelets 37 (L) 150 - 440 K/uL   No results found.  Assessment:  The patient is a 54 y.o. woman with lupus and immune mediated thrombocytopenic  purpura (ITP). She presented with a platelet count of 5,000 on 04/07/2015. She was treated successfully with steroids.  She completed her steroid taper on 06/20/2015. Platelet count at discontinuation of steroids was 133,000.  Initial work-up in 03/2015 included the following normal labs: PT/INR, fibrinogen, B12, folate, TSH , HIV screen, hepatitis C antibody, and H. pylori serologies . Hepatitis B core antibody total and hepatitis B surface antigen were negative. Hepatitis B surface antibody was positive consistent with immunity. Immunoglobulin levels revealed an IgG of 2894, IgA 156, and IgM 123. Lupus anticoagulant panel was negative. PTT corrected with mix (? factor deficiency VIII, IX, XI, XII, HMWK, prekallikrein). LDH was 217 (98-192). Uric acid was 3.0.  She has recurrent severe immune mediated thrombocytopenia.  She was started on prednisone on 07/25/2015.  Platelet count is < 5,000.  She has lower extremity petechiae and few bruises.  She has no bleeding or mucosal bleeding.  She has lower extremity pain (? myalgias)  Plan:   1.  Discuss recurrent ITP- I reviewed the results of CBC from this morning, and it appears that her platelet count has improved to 37,000 cells per microliter. The patient appears to be clinically improving. We can switch her to oral prednisone 60 mg a day, which she will need to have tapered off very slowly. There is no need to continue IVIG. Patient can be discharged home today with short follow-up with Dr. Mike Gip.  2.  Discuss evaluation of elevated PTT, initially thought secondary to lupus anticoagulant, but not documented at last admission.  Labs ordered:  PTT with mix, repeat lupus anticoagulant panel, anticardiolipin antibodies.  If PTT corrects with mix will perform factor studies. 3. Continue Omeprazole for GI prophylaxis.  Discussed with Dr. Benjie Karvonen. Patient will be discharged today.      Dana Hires, MD  07/29/2015, 10:07 AM

## 2015-07-29 NOTE — Progress Notes (Signed)
Pt discharged from hospital.  Transported via car by her sister.  Clarise Cruz, RN

## 2015-08-01 ENCOUNTER — Inpatient Hospital Stay: Payer: 59

## 2015-08-01 ENCOUNTER — Encounter: Payer: Self-pay | Admitting: Hematology and Oncology

## 2015-08-01 ENCOUNTER — Inpatient Hospital Stay (HOSPITAL_BASED_OUTPATIENT_CLINIC_OR_DEPARTMENT_OTHER): Payer: 59 | Admitting: Hematology and Oncology

## 2015-08-01 VITALS — BP 165/93 | HR 85 | Temp 98.1°F | Resp 16 | Ht 67.5 in | Wt 139.8 lb

## 2015-08-01 DIAGNOSIS — D693 Immune thrombocytopenic purpura: Secondary | ICD-10-CM

## 2015-08-01 DIAGNOSIS — Z79899 Other long term (current) drug therapy: Secondary | ICD-10-CM

## 2015-08-01 DIAGNOSIS — M329 Systemic lupus erythematosus, unspecified: Secondary | ICD-10-CM | POA: Diagnosis not present

## 2015-08-01 LAB — COMPREHENSIVE METABOLIC PANEL
ALT: 18 U/L (ref 14–54)
AST: 21 U/L (ref 15–41)
Albumin: 3.5 g/dL (ref 3.5–5.0)
Alkaline Phosphatase: 64 U/L (ref 38–126)
Anion gap: 7 (ref 5–15)
BUN: 19 mg/dL (ref 6–20)
CO2: 27 mmol/L (ref 22–32)
Calcium: 9.1 mg/dL (ref 8.9–10.3)
Chloride: 103 mmol/L (ref 101–111)
Creatinine, Ser: 0.87 mg/dL (ref 0.44–1.00)
GFR calc Af Amer: >60 mL/min (ref >60)
GFR calc non Af Amer: >60 mL/min (ref >60)
Glucose, Bld: 143 mg/dL — ABNORMAL HIGH (ref 65–99)
Potassium: 4.4 mmol/L (ref 3.5–5.1)
Sodium: 137 mmol/L (ref 135–145)
Total Bilirubin: 1.2 mg/dL (ref 0.3–1.2)
Total Protein: 8.4 g/dL — ABNORMAL HIGH (ref 6.5–8.1)

## 2015-08-01 LAB — CBC WITH DIFFERENTIAL/PLATELET
Basophils Absolute: 0 10*3/uL (ref 0–0.1)
Basophils Relative: 0 %
Eosinophils Absolute: 0 10*3/uL (ref 0–0.7)
Eosinophils Relative: 0 %
HCT: 35.1 % (ref 35.0–47.0)
Hemoglobin: 11.4 g/dL — ABNORMAL LOW (ref 12.0–16.0)
Lymphocytes Relative: 15 %
Lymphs Abs: 1.5 10*3/uL (ref 1.0–3.6)
MCH: 29.4 pg (ref 26.0–34.0)
MCHC: 32.6 g/dL (ref 32.0–36.0)
MCV: 90.1 fL (ref 80.0–100.0)
Monocytes Absolute: 0.5 10*3/uL (ref 0.2–0.9)
Monocytes Relative: 5 %
Neutro Abs: 8.5 10*3/uL — ABNORMAL HIGH (ref 1.4–6.5)
Neutrophils Relative %: 80 %
Platelets: 123 10*3/uL — ABNORMAL LOW (ref 150–440)
RBC: 3.89 MIL/uL (ref 3.80–5.20)
RDW: 14.3 % (ref 11.5–14.5)
WBC: 10.6 10*3/uL (ref 3.6–11.0)
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
## 2015-08-02 ENCOUNTER — Other Ambulatory Visit: Payer: Self-pay

## 2015-08-02 DIAGNOSIS — D696 Thrombocytopenia, unspecified: Secondary | ICD-10-CM

## 2015-08-02 LAB — PTT FACTOR INHIBITOR (MIXING STUDY)
aPTT 1:1 Mix Saline: >121.9 s
aPTT 1:1 NP Incub. Mix Ctl: 23.7 s (ref 22.9–30.2)
aPTT 1:1 NP Mix, 60 Min,Incub.: 23.8 s (ref 22.9–30.2)
aPTT 1:1 Normal Plasma: 23.9 s (ref 22.9–30.2)
aPTT: >121.9 s — ABNORMAL HIGH (ref 22.9–30.2)

## 2015-08-02 LAB — LUPUS ANTICOAGULANT PANEL
DRVVT: 35.4 s (ref 0.0–44.0)
PTT Lupus Anticoagulant: 37 s (ref 0.0–40.6)

## 2015-08-02 LAB — CARDIOLIPIN ANTIBODIES, IGG, IGM, IGA
Anticardiolipin IgA: <9 APL U/mL (ref 0–11)
Anticardiolipin IgG: <9 GPL U/mL (ref 0–14)
Anticardiolipin IgM: <9 MPL U/mL (ref 0–12)

## 2015-08-07 ENCOUNTER — Other Ambulatory Visit: Payer: Self-pay | Admitting: Hematology and Oncology

## 2015-08-08 ENCOUNTER — Inpatient Hospital Stay: Payer: 59

## 2015-08-08 ENCOUNTER — Other Ambulatory Visit: Payer: Self-pay

## 2015-08-08 ENCOUNTER — Ambulatory Visit: Admission: RE | Admit: 2015-08-08 | Payer: 59 | Source: Ambulatory Visit

## 2015-08-08 DIAGNOSIS — D696 Thrombocytopenia, unspecified: Secondary | ICD-10-CM

## 2015-08-08 DIAGNOSIS — D693 Immune thrombocytopenic purpura: Secondary | ICD-10-CM

## 2015-08-08 LAB — CBC WITH DIFFERENTIAL/PLATELET
Basophils Absolute: 0 10*3/uL (ref 0–0.1)
Basophils Relative: 0 %
Eosinophils Absolute: 0 10*3/uL (ref 0–0.7)
Eosinophils Relative: 0 %
HCT: 37.8 % (ref 35.0–47.0)
Hemoglobin: 12.2 g/dL (ref 12.0–16.0)
Lymphocytes Relative: 6 %
Lymphs Abs: 0.8 10*3/uL — ABNORMAL LOW (ref 1.0–3.6)
MCH: 29.5 pg (ref 26.0–34.0)
MCHC: 32.3 g/dL (ref 32.0–36.0)
MCV: 91.3 fL (ref 80.0–100.0)
Monocytes Absolute: 0.3 10*3/uL (ref 0.2–0.9)
Monocytes Relative: 2 %
Neutro Abs: 12.7 10*3/uL — ABNORMAL HIGH (ref 1.4–6.5)
Neutrophils Relative %: 92 %
Platelets: 113 10*3/uL — ABNORMAL LOW (ref 150–440)
RBC: 4.14 MIL/uL (ref 3.80–5.20)
RDW: 15.1 % — ABNORMAL HIGH (ref 11.5–14.5)
WBC: 13.8 10*3/uL — ABNORMAL HIGH (ref 3.6–11.0)

## 2015-08-09 ENCOUNTER — Other Ambulatory Visit: Payer: Self-pay

## 2015-08-15 ENCOUNTER — Ambulatory Visit: Payer: 59 | Admitting: Hematology and Oncology

## 2015-08-15 ENCOUNTER — Other Ambulatory Visit: Payer: Self-pay

## 2015-08-15 ENCOUNTER — Inpatient Hospital Stay: Payer: 59

## 2015-08-15 DIAGNOSIS — D693 Immune thrombocytopenic purpura: Secondary | ICD-10-CM

## 2015-08-15 LAB — CBC WITH DIFFERENTIAL/PLATELET
Basophils Absolute: 0 10*3/uL (ref 0–0.1)
Basophils Relative: 0 %
Eosinophils Absolute: 0 10*3/uL (ref 0–0.7)
Eosinophils Relative: 0 %
HCT: 40.1 % (ref 35.0–47.0)
Hemoglobin: 12.9 g/dL (ref 12.0–16.0)
Lymphocytes Relative: 7 %
Lymphs Abs: 0.9 10*3/uL — ABNORMAL LOW (ref 1.0–3.6)
MCH: 29.2 pg (ref 26.0–34.0)
MCHC: 32.1 g/dL (ref 32.0–36.0)
MCV: 90.9 fL (ref 80.0–100.0)
Monocytes Absolute: 0.3 10*3/uL (ref 0.2–0.9)
Monocytes Relative: 2 %
Neutro Abs: 13.1 10*3/uL — ABNORMAL HIGH (ref 1.4–6.5)
Neutrophils Relative %: 91 %
Platelets: 163 10*3/uL (ref 150–440)
RBC: 4.41 MIL/uL (ref 3.80–5.20)
RDW: 15.2 % — ABNORMAL HIGH (ref 11.5–14.5)
WBC: 14.4 10*3/uL — ABNORMAL HIGH (ref 3.6–11.0)

## 2015-08-16 LAB — MISC LABCORP TEST (SEND OUT): LabCorp test name: 180935

## 2015-08-22 ENCOUNTER — Inpatient Hospital Stay: Payer: 59 | Attending: Hematology and Oncology

## 2015-08-22 ENCOUNTER — Ambulatory Visit
Admission: RE | Admit: 2015-08-22 | Discharge: 2015-08-22 | Disposition: A | Discharge status: Home / Self Care | Payer: 59 | Source: Ambulatory Visit | Attending: Hematology and Oncology | Admitting: Hematology and Oncology

## 2015-08-22 DIAGNOSIS — R918 Other nonspecific abnormal finding of lung field: Secondary | ICD-10-CM | POA: Insufficient documentation

## 2015-08-22 DIAGNOSIS — D259 Leiomyoma of uterus, unspecified: Secondary | ICD-10-CM | POA: Insufficient documentation

## 2015-08-22 DIAGNOSIS — R61 Generalized hyperhidrosis: Secondary | ICD-10-CM | POA: Diagnosis not present

## 2015-08-22 DIAGNOSIS — Z79899 Other long term (current) drug therapy: Secondary | ICD-10-CM | POA: Insufficient documentation

## 2015-08-22 DIAGNOSIS — D693 Immune thrombocytopenic purpura: Secondary | ICD-10-CM | POA: Insufficient documentation

## 2015-08-22 DIAGNOSIS — M329 Systemic lupus erythematosus, unspecified: Secondary | ICD-10-CM | POA: Insufficient documentation

## 2015-08-22 LAB — CBC WITH DIFFERENTIAL/PLATELET
Basophils Absolute: 0.1 10*3/uL (ref 0–0.1)
Basophils Relative: 1 %
Eosinophils Absolute: 0 10*3/uL (ref 0–0.7)
Eosinophils Relative: 0 %
HCT: 41.4 % (ref 35.0–47.0)
Hemoglobin: 13.5 g/dL (ref 12.0–16.0)
Lymphocytes Relative: 28 %
Lymphs Abs: 2.6 10*3/uL (ref 1.0–3.6)
MCH: 29.7 pg (ref 26.0–34.0)
MCHC: 32.7 g/dL (ref 32.0–36.0)
MCV: 91 fL (ref 80.0–100.0)
Monocytes Absolute: 0.5 10*3/uL (ref 0.2–0.9)
Monocytes Relative: 6 %
Neutro Abs: 6 10*3/uL (ref 1.4–6.5)
Neutrophils Relative %: 65 %
Platelets: 173 10*3/uL (ref 150–440)
RBC: 4.54 MIL/uL (ref 3.80–5.20)
RDW: 15 % — ABNORMAL HIGH (ref 11.5–14.5)
WBC: 9.2 10*3/uL (ref 3.6–11.0)

## 2015-08-22 MED ORDER — IOHEXOL 350 MG/ML SOLN
100.0000 mL | Freq: Once | INTRAVENOUS | Status: AC | PRN
  Administered 2015-08-22: 100 mL via INTRAVENOUS

## 2015-08-22 NOTE — Telephone Encounter (Signed)
Advised patient to decrease prednisone to 40mg  daily per Dr. Mike Gip  since plts have increased to 173. Patient gave verbal understanding to this.

## 2015-08-29 ENCOUNTER — Inpatient Hospital Stay: Payer: 59

## 2015-08-29 ENCOUNTER — Inpatient Hospital Stay (HOSPITAL_BASED_OUTPATIENT_CLINIC_OR_DEPARTMENT_OTHER): Payer: 59 | Admitting: Hematology and Oncology

## 2015-08-29 VITALS — BP 169/85 | HR 74 | Temp 98.3°F | Ht 67.5 in | Wt 142.4 lb

## 2015-08-29 DIAGNOSIS — D693 Immune thrombocytopenic purpura: Secondary | ICD-10-CM

## 2015-08-29 DIAGNOSIS — M329 Systemic lupus erythematosus, unspecified: Secondary | ICD-10-CM

## 2015-08-29 DIAGNOSIS — D696 Thrombocytopenia, unspecified: Secondary | ICD-10-CM

## 2015-08-29 DIAGNOSIS — Z79899 Other long term (current) drug therapy: Secondary | ICD-10-CM | POA: Diagnosis not present

## 2015-08-29 LAB — CBC WITH DIFFERENTIAL/PLATELET
Basophils Absolute: 0.1 10*3/uL (ref 0–0.1)
Basophils Relative: 1 %
Eosinophils Absolute: 0 10*3/uL (ref 0–0.7)
Eosinophils Relative: 0 %
HCT: 42.9 % (ref 35.0–47.0)
Hemoglobin: 13.9 g/dL (ref 12.0–16.0)
Lymphocytes Relative: 21 %
Lymphs Abs: 1.9 10*3/uL (ref 1.0–3.6)
MCH: 29.8 pg (ref 26.0–34.0)
MCHC: 32.4 g/dL (ref 32.0–36.0)
MCV: 92 fL (ref 80.0–100.0)
Monocytes Absolute: 0.5 10*3/uL (ref 0.2–0.9)
Monocytes Relative: 6 %
Neutro Abs: 6.7 10*3/uL — ABNORMAL HIGH (ref 1.4–6.5)
Neutrophils Relative %: 72 %
Platelets: 183 10*3/uL (ref 150–440)
RBC: 4.67 MIL/uL (ref 3.80–5.20)
RDW: 15.2 % — ABNORMAL HIGH (ref 11.5–14.5)
WBC: 9.3 10*3/uL (ref 3.6–11.0)

## 2015-09-05 ENCOUNTER — Telehealth: Payer: Self-pay

## 2015-09-05 ENCOUNTER — Inpatient Hospital Stay: Payer: 59

## 2015-09-05 DIAGNOSIS — D693 Immune thrombocytopenic purpura: Secondary | ICD-10-CM

## 2015-09-05 LAB — CBC WITH DIFFERENTIAL/PLATELET
Basophils Absolute: 0 10*3/uL (ref 0–0.1)
Basophils Relative: 0 %
Eosinophils Absolute: 0 10*3/uL (ref 0–0.7)
Eosinophils Relative: 0 %
HCT: 42.6 % (ref 35.0–47.0)
Hemoglobin: 13.9 g/dL (ref 12.0–16.0)
Lymphocytes Relative: 12 %
Lymphs Abs: 1.1 10*3/uL (ref 1.0–3.6)
MCH: 30 pg (ref 26.0–34.0)
MCHC: 32.7 g/dL (ref 32.0–36.0)
MCV: 91.8 fL (ref 80.0–100.0)
Monocytes Absolute: 0.3 10*3/uL (ref 0.2–0.9)
Monocytes Relative: 4 %
Neutro Abs: 7.4 10*3/uL — ABNORMAL HIGH (ref 1.4–6.5)
Neutrophils Relative %: 84 %
Platelets: 175 10*3/uL (ref 150–440)
RBC: 4.63 MIL/uL (ref 3.80–5.20)
RDW: 15 % — ABNORMAL HIGH (ref 11.5–14.5)
WBC: 8.9 10*3/uL (ref 3.6–11.0)

## 2015-09-05 NOTE — Telephone Encounter (Signed)
Pt advised to decrease prednisone dosage from 30mg  to 20mg  daily per MD today.  Pt had labs drawn in clinic.  Pt verbalized an understanding.

## 2015-09-12 ENCOUNTER — Encounter: Payer: Self-pay | Admitting: *Deleted

## 2015-09-12 ENCOUNTER — Inpatient Hospital Stay: Payer: 59

## 2015-09-12 DIAGNOSIS — D696 Thrombocytopenia, unspecified: Secondary | ICD-10-CM

## 2015-09-12 DIAGNOSIS — D693 Immune thrombocytopenic purpura: Secondary | ICD-10-CM | POA: Diagnosis not present

## 2015-09-12 LAB — CBC WITH DIFFERENTIAL/PLATELET
Basophils Absolute: 0.1 10*3/uL (ref 0–0.1)
Basophils Relative: 1 %
Eosinophils Absolute: 0 10*3/uL (ref 0–0.7)
Eosinophils Relative: 0 %
HCT: 43.6 % (ref 35.0–47.0)
Hemoglobin: 14 g/dL (ref 12.0–16.0)
Lymphocytes Relative: 15 %
Lymphs Abs: 1.4 10*3/uL (ref 1.0–3.6)
MCH: 29.9 pg (ref 26.0–34.0)
MCHC: 32.3 g/dL (ref 32.0–36.0)
MCV: 92.7 fL (ref 80.0–100.0)
Monocytes Absolute: 0.4 10*3/uL (ref 0.2–0.9)
Monocytes Relative: 4 %
Neutro Abs: 7.1 10*3/uL — ABNORMAL HIGH (ref 1.4–6.5)
Neutrophils Relative %: 80 %
Platelets: 197 10*3/uL (ref 150–440)
RBC: 4.7 MIL/uL (ref 3.80–5.20)
RDW: 15 % — ABNORMAL HIGH (ref 11.5–14.5)
WBC: 9 10*3/uL (ref 3.6–11.0)

## 2015-09-12 NOTE — Progress Notes (Signed)
Platelets 197 today. Pt in clinic as a lab only appt. Advised pt to decrease prednisone to 10mg  daily.

## 2015-09-19 ENCOUNTER — Encounter: Payer: Self-pay | Admitting: Hematology and Oncology

## 2015-09-19 ENCOUNTER — Inpatient Hospital Stay (HOSPITAL_BASED_OUTPATIENT_CLINIC_OR_DEPARTMENT_OTHER): Payer: 59 | Admitting: Hematology and Oncology

## 2015-09-19 ENCOUNTER — Inpatient Hospital Stay: Payer: 59 | Attending: Hematology and Oncology

## 2015-09-19 VITALS — BP 164/86 | HR 90 | Temp 96.7°F | Resp 18 | Ht 67.5 in | Wt 142.6 lb

## 2015-09-19 DIAGNOSIS — Z79899 Other long term (current) drug therapy: Secondary | ICD-10-CM | POA: Diagnosis not present

## 2015-09-19 DIAGNOSIS — D241 Benign neoplasm of right breast: Secondary | ICD-10-CM

## 2015-09-19 DIAGNOSIS — M329 Systemic lupus erythematosus, unspecified: Secondary | ICD-10-CM | POA: Diagnosis not present

## 2015-09-19 DIAGNOSIS — D696 Thrombocytopenia, unspecified: Secondary | ICD-10-CM

## 2015-09-19 DIAGNOSIS — D693 Immune thrombocytopenic purpura: Secondary | ICD-10-CM

## 2015-09-19 LAB — CBC WITH DIFFERENTIAL/PLATELET
Basophils Absolute: 0.1 10*3/uL (ref 0–0.1)
Basophils Relative: 1 %
Eosinophils Absolute: 0 10*3/uL (ref 0–0.7)
Eosinophils Relative: 0 %
HCT: 44.1 % (ref 35.0–47.0)
Hemoglobin: 14.5 g/dL (ref 12.0–16.0)
Lymphocytes Relative: 15 %
Lymphs Abs: 1.2 10*3/uL (ref 1.0–3.6)
MCH: 30 pg (ref 26.0–34.0)
MCHC: 32.8 g/dL (ref 32.0–36.0)
MCV: 91.4 fL (ref 80.0–100.0)
Monocytes Absolute: 0.5 10*3/uL (ref 0.2–0.9)
Monocytes Relative: 6 %
Neutro Abs: 6.6 10*3/uL — ABNORMAL HIGH (ref 1.4–6.5)
Neutrophils Relative %: 78 %
Platelets: 177 10*3/uL (ref 150–440)
RBC: 4.82 MIL/uL (ref 3.80–5.20)
RDW: 15.3 % — ABNORMAL HIGH (ref 11.5–14.5)
WBC: 8.4 10*3/uL (ref 3.6–11.0)
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
## 2015-09-19 NOTE — Progress Notes (Signed)
No changes since last visit.  Night sweats almost gone.  No other concerns noted

## 2015-09-24 ENCOUNTER — Other Ambulatory Visit: Payer: Self-pay | Admitting: Family Medicine

## 2015-09-25 ENCOUNTER — Inpatient Hospital Stay: Payer: 59

## 2015-09-25 ENCOUNTER — Telehealth: Payer: Self-pay

## 2015-09-25 DIAGNOSIS — D693 Immune thrombocytopenic purpura: Secondary | ICD-10-CM | POA: Diagnosis not present

## 2015-09-25 LAB — CBC WITH DIFFERENTIAL/PLATELET
Basophils Absolute: 0 10*3/uL (ref 0–0.1)
Basophils Relative: 1 %
Eosinophils Absolute: 0 10*3/uL (ref 0–0.7)
Eosinophils Relative: 0 %
HCT: 44.3 % (ref 35.0–47.0)
Hemoglobin: 14.6 g/dL (ref 12.0–16.0)
Lymphocytes Relative: 11 %
Lymphs Abs: 0.6 10*3/uL — ABNORMAL LOW (ref 1.0–3.6)
MCH: 30 pg (ref 26.0–34.0)
MCHC: 33 g/dL (ref 32.0–36.0)
MCV: 90.9 fL (ref 80.0–100.0)
Monocytes Absolute: 0.3 10*3/uL (ref 0.2–0.9)
Monocytes Relative: 5 %
Neutro Abs: 4.4 10*3/uL (ref 1.4–6.5)
Neutrophils Relative %: 83 %
Platelets: 154 10*3/uL (ref 150–440)
RBC: 4.87 MIL/uL (ref 3.80–5.20)
RDW: 14.7 % — ABNORMAL HIGH (ref 11.5–14.5)
WBC: 5.3 10*3/uL (ref 3.6–11.0)

## 2015-09-25 NOTE — Telephone Encounter (Signed)
Called pt per Dana Paganini NP platelet count at 154 ok to stop taking Prednisone.  Recheck labs next week.  Pt verbalized an understanding.  No other concerns noted

## 2015-09-30 ENCOUNTER — Encounter: Payer: Self-pay | Admitting: Hematology and Oncology

## 2015-10-03 ENCOUNTER — Inpatient Hospital Stay: Payer: 59

## 2015-10-03 DIAGNOSIS — D693 Immune thrombocytopenic purpura: Secondary | ICD-10-CM | POA: Diagnosis not present

## 2015-10-03 LAB — CBC WITH DIFFERENTIAL/PLATELET
Basophils Absolute: 0 10*3/uL (ref 0–0.1)
Basophils Relative: 1 %
Eosinophils Absolute: 0.1 10*3/uL (ref 0–0.7)
Eosinophils Relative: 1 %
HCT: 44.5 % (ref 35.0–47.0)
Hemoglobin: 14.6 g/dL (ref 12.0–16.0)
Lymphocytes Relative: 22 %
Lymphs Abs: 0.9 10*3/uL — ABNORMAL LOW (ref 1.0–3.6)
MCH: 29.8 pg (ref 26.0–34.0)
MCHC: 32.8 g/dL (ref 32.0–36.0)
MCV: 90.9 fL (ref 80.0–100.0)
Monocytes Absolute: 0.4 10*3/uL (ref 0.2–0.9)
Monocytes Relative: 9 %
Neutro Abs: 2.9 10*3/uL (ref 1.4–6.5)
Neutrophils Relative %: 67 %
Platelets: 137 10*3/uL — ABNORMAL LOW (ref 150–440)
RBC: 4.9 MIL/uL (ref 3.80–5.20)
RDW: 14.1 % (ref 11.5–14.5)
WBC: 4.3 10*3/uL (ref 3.6–11.0)

## 2015-10-04 ENCOUNTER — Encounter: Payer: Self-pay | Admitting: Hematology and Oncology

## 2015-10-10 ENCOUNTER — Other Ambulatory Visit: Payer: Self-pay

## 2015-10-10 ENCOUNTER — Inpatient Hospital Stay: Payer: 59

## 2015-10-10 ENCOUNTER — Ambulatory Visit (HOSPITAL_BASED_OUTPATIENT_CLINIC_OR_DEPARTMENT_OTHER): Payer: 59 | Admitting: Hematology and Oncology

## 2015-10-10 VITALS — BP 137/82 | HR 85 | Temp 98.8°F | Resp 18 | Wt 145.1 lb

## 2015-10-10 DIAGNOSIS — D693 Immune thrombocytopenic purpura: Secondary | ICD-10-CM

## 2015-10-10 DIAGNOSIS — M329 Systemic lupus erythematosus, unspecified: Secondary | ICD-10-CM

## 2015-10-10 DIAGNOSIS — Z79899 Other long term (current) drug therapy: Secondary | ICD-10-CM

## 2015-10-10 DIAGNOSIS — D241 Benign neoplasm of right breast: Secondary | ICD-10-CM

## 2015-10-10 LAB — CBC WITH DIFFERENTIAL/PLATELET
Basophils Absolute: 0 10*3/uL (ref 0–0.1)
Basophils Relative: 1 %
Eosinophils Absolute: 0.1 10*3/uL (ref 0–0.7)
Eosinophils Relative: 3 %
HCT: 42.7 % (ref 35.0–47.0)
Hemoglobin: 14 g/dL (ref 12.0–16.0)
Lymphocytes Relative: 26 %
Lymphs Abs: 1.1 10*3/uL (ref 1.0–3.6)
MCH: 29.7 pg (ref 26.0–34.0)
MCHC: 32.7 g/dL (ref 32.0–36.0)
MCV: 90.6 fL (ref 80.0–100.0)
Monocytes Absolute: 0.5 10*3/uL (ref 0.2–0.9)
Monocytes Relative: 11 %
Neutro Abs: 2.7 10*3/uL (ref 1.4–6.5)
Neutrophils Relative %: 59 %
Platelets: 105 10*3/uL — ABNORMAL LOW (ref 150–440)
RBC: 4.71 MIL/uL (ref 3.80–5.20)
RDW: 14.5 % (ref 11.5–14.5)
WBC: 4.4 10*3/uL (ref 3.6–11.0)
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
## 2015-10-10 NOTE — Progress Notes (Signed)
Pt here for ongoing eval of ITP. She has been completely off her prednisone for a couple of weeks. Platelets have dropped to 105 today. Pt denies any bleeding. Feels at her baseline.

## 2015-10-14 ENCOUNTER — Encounter: Payer: Self-pay | Admitting: Hematology and Oncology

## 2015-10-17 ENCOUNTER — Other Ambulatory Visit: Payer: Self-pay | Admitting: Hematology and Oncology

## 2015-10-17 ENCOUNTER — Inpatient Hospital Stay (HOSPITAL_BASED_OUTPATIENT_CLINIC_OR_DEPARTMENT_OTHER): Payer: 59 | Admitting: Hematology and Oncology

## 2015-10-17 ENCOUNTER — Inpatient Hospital Stay: Payer: 59 | Attending: Hematology and Oncology

## 2015-10-17 VITALS — BP 167/80 | HR 71 | Temp 96.6°F | Resp 18 | Ht 67.5 in | Wt 144.8 lb

## 2015-10-17 DIAGNOSIS — D693 Immune thrombocytopenic purpura: Secondary | ICD-10-CM | POA: Diagnosis not present

## 2015-10-17 DIAGNOSIS — Z79899 Other long term (current) drug therapy: Secondary | ICD-10-CM | POA: Diagnosis not present

## 2015-10-17 DIAGNOSIS — M329 Systemic lupus erythematosus, unspecified: Secondary | ICD-10-CM | POA: Insufficient documentation

## 2015-10-17 DIAGNOSIS — H1131 Conjunctival hemorrhage, right eye: Secondary | ICD-10-CM

## 2015-10-17 DIAGNOSIS — Z5111 Encounter for antineoplastic chemotherapy: Secondary | ICD-10-CM | POA: Insufficient documentation

## 2015-10-17 LAB — CBC WITH DIFFERENTIAL/PLATELET
Basophils Absolute: 0 10*3/uL (ref 0–0.1)
Basophils Relative: 1 %
Eosinophils Absolute: 0.2 10*3/uL (ref 0–0.7)
Eosinophils Relative: 4 %
HCT: 42.8 % (ref 35.0–47.0)
Hemoglobin: 14.1 g/dL (ref 12.0–16.0)
Lymphocytes Relative: 18 %
Lymphs Abs: 0.8 10*3/uL — ABNORMAL LOW (ref 1.0–3.6)
MCH: 29.6 pg (ref 26.0–34.0)
MCHC: 33 g/dL (ref 32.0–36.0)
MCV: 89.8 fL (ref 80.0–100.0)
Monocytes Absolute: 0.4 10*3/uL (ref 0.2–0.9)
Monocytes Relative: 9 %
Neutro Abs: 3.1 10*3/uL (ref 1.4–6.5)
Neutrophils Relative %: 68 %
Platelets: 30 10*3/uL — ABNORMAL LOW (ref 150–440)
RBC: 4.77 MIL/uL (ref 3.80–5.20)
RDW: 14.3 % (ref 11.5–14.5)
WBC: 4.5 10*3/uL (ref 3.6–11.0)

## 2015-10-17 LAB — COMPREHENSIVE METABOLIC PANEL
ALT: 15 U/L (ref 14–54)
AST: 21 U/L (ref 15–41)
Albumin: 4 g/dL (ref 3.5–5.0)
Alkaline Phosphatase: 79 U/L (ref 38–126)
Anion gap: 5 (ref 5–15)
BUN: 14 mg/dL (ref 6–20)
CO2: 29 mmol/L (ref 22–32)
Calcium: 8.7 mg/dL — ABNORMAL LOW (ref 8.9–10.3)
Chloride: 103 mmol/L (ref 101–111)
Creatinine, Ser: 0.82 mg/dL (ref 0.44–1.00)
GFR calc Af Amer: >60 mL/min (ref >60)
GFR calc non Af Amer: >60 mL/min (ref >60)
Glucose, Bld: 88 mg/dL (ref 65–99)
Potassium: 4.3 mmol/L (ref 3.5–5.1)
Sodium: 137 mmol/L (ref 135–145)
Total Bilirubin: 0.8 mg/dL (ref 0.3–1.2)
Total Protein: 7.3 g/dL (ref 6.5–8.1)

## 2015-10-17 MED ORDER — PREDNISONE 20 MG PO TABS: 60.0000 mg | ORAL_TABLET | Freq: Every day | ORAL | Status: DC
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
## 2015-10-18 ENCOUNTER — Other Ambulatory Visit: Payer: Self-pay | Admitting: Hematology and Oncology

## 2015-10-18 ENCOUNTER — Telehealth: Payer: Self-pay

## 2015-10-18 ENCOUNTER — Encounter: Payer: Self-pay | Admitting: Hematology and Oncology

## 2015-10-18 LAB — C4 COMPLEMENT: Complement C4, Body Fluid: 16 mg/dL (ref 14–44)

## 2015-10-18 LAB — HEPATITIS C ANTIBODY: HCV Ab: <0.1 s/co ratio (ref 0.0–0.9)

## 2015-10-18 LAB — ANTI-DNA ANTIBODY, DOUBLE-STRANDED: ds DNA Ab: 36 IU/mL — ABNORMAL HIGH (ref 0–9)

## 2015-10-18 LAB — HEPATITIS B SURFACE ANTIGEN: Hepatitis B Surface Ag: NEGATIVE

## 2015-10-18 LAB — HEPATITIS B CORE ANTIBODY, TOTAL: Hep B Core Total Ab: NEGATIVE

## 2015-10-18 LAB — C3 COMPLEMENT: C3 Complement: 83 mg/dL (ref 82–167)

## 2015-10-18 NOTE — Telephone Encounter (Signed)
Called pt per request and left VM about Rituxan so far and insurance approval not going through.  Left VM As of now cannot do RItuxan on Friday.  Please monitior for bleeding (gums increased petechiae, take bleeding precautions and if notices any bleeding or signs of bleeding report directly to ER.  As of this moment we are trying for Monday for Rituxan Infusion upon scheduling approval

## 2015-10-19 ENCOUNTER — Inpatient Hospital Stay: Payer: 59

## 2015-10-22 ENCOUNTER — Inpatient Hospital Stay: Payer: 59

## 2015-10-22 ENCOUNTER — Other Ambulatory Visit: Payer: Self-pay | Admitting: Hematology and Oncology

## 2015-10-22 VITALS — BP 147/79 | HR 70 | Resp 20

## 2015-10-22 DIAGNOSIS — D693 Immune thrombocytopenic purpura: Secondary | ICD-10-CM

## 2015-10-22 LAB — CBC WITH DIFFERENTIAL/PLATELET
Basophils Absolute: 0 10*3/uL (ref 0–0.1)
Basophils Relative: 0 %
Eosinophils Absolute: 0 10*3/uL (ref 0–0.7)
Eosinophils Relative: 0 %
HCT: 43.3 % (ref 35.0–47.0)
Hemoglobin: 14.5 g/dL (ref 12.0–16.0)
Lymphocytes Relative: 19 %
Lymphs Abs: 1.9 10*3/uL (ref 1.0–3.6)
MCH: 29.9 pg (ref 26.0–34.0)
MCHC: 33.4 g/dL (ref 32.0–36.0)
MCV: 89.4 fL (ref 80.0–100.0)
Monocytes Absolute: 0.8 10*3/uL (ref 0.2–0.9)
Monocytes Relative: 8 %
Neutro Abs: 7.1 10*3/uL — ABNORMAL HIGH (ref 1.4–6.5)
Neutrophils Relative %: 73 %
Platelets: 83 10*3/uL — ABNORMAL LOW (ref 150–440)
RBC: 4.84 MIL/uL (ref 3.80–5.20)
RDW: 14.1 % (ref 11.5–14.5)
WBC: 9.8 10*3/uL (ref 3.6–11.0)

## 2015-10-22 MED ORDER — ACETAMINOPHEN 325 MG PO TABS
650.0000 mg | ORAL_TABLET | Freq: Once | ORAL | Status: AC
Start: 2015-10-22 — End: 2015-10-22
  Administered 2015-10-22: 650 mg via ORAL
  Filled 2015-10-22: qty 2

## 2015-10-22 MED ORDER — SODIUM CHLORIDE 0.9 % IV SOLN
375.0000 mg/m2 | Freq: Once | INTRAVENOUS | Status: AC
  Administered 2015-10-22: 600 mg via INTRAVENOUS
  Filled 2015-10-22: qty 50

## 2015-10-22 MED ORDER — DIPHENHYDRAMINE HCL 25 MG PO CAPS
50.0000 mg | ORAL_CAPSULE | Freq: Once | ORAL | Status: AC
  Administered 2015-10-22: 50 mg via ORAL
  Filled 2015-10-22: qty 2

## 2015-10-24 ENCOUNTER — Inpatient Hospital Stay: Payer: 59

## 2015-10-24 ENCOUNTER — Ambulatory Visit: Payer: 59

## 2015-10-24 ENCOUNTER — Telehealth: Payer: Self-pay

## 2015-10-24 ENCOUNTER — Ambulatory Visit: Payer: 59 | Admitting: Hematology and Oncology

## 2015-10-24 DIAGNOSIS — D693 Immune thrombocytopenic purpura: Secondary | ICD-10-CM | POA: Diagnosis not present

## 2015-10-24 LAB — CBC WITH DIFFERENTIAL/PLATELET
Basophils Absolute: 0.1 10*3/uL (ref 0–0.1)
Basophils Relative: 1 %
Eosinophils Absolute: 0 10*3/uL (ref 0–0.7)
Eosinophils Relative: 0 %
HCT: 42.4 % (ref 35.0–47.0)
Hemoglobin: 13.8 g/dL (ref 12.0–16.0)
Lymphocytes Relative: 15 %
Lymphs Abs: 1.4 10*3/uL (ref 1.0–3.6)
MCH: 29.3 pg (ref 26.0–34.0)
MCHC: 32.5 g/dL (ref 32.0–36.0)
MCV: 90.2 fL (ref 80.0–100.0)
Monocytes Absolute: 1.1 10*3/uL — ABNORMAL HIGH (ref 0.2–0.9)
Monocytes Relative: 11 %
Neutro Abs: 6.7 10*3/uL — ABNORMAL HIGH (ref 1.4–6.5)
Neutrophils Relative %: 73 %
Platelets: 95 10*3/uL — ABNORMAL LOW (ref 150–440)
RBC: 4.7 MIL/uL (ref 3.80–5.20)
RDW: 14.4 % (ref 11.5–14.5)
WBC: 9.3 10*3/uL (ref 3.6–11.0)

## 2015-10-24 NOTE — Telephone Encounter (Signed)
Pt came into clinic for labs today.  Per MD pt to stay on 60mg  prednisone.  RTC next week.  Pt verbalized an understanding and verbalized no other issues or concerns.

## 2015-10-31 ENCOUNTER — Encounter: Payer: Self-pay | Admitting: Hematology and Oncology

## 2015-10-31 ENCOUNTER — Inpatient Hospital Stay: Payer: 59

## 2015-10-31 ENCOUNTER — Other Ambulatory Visit: Payer: Self-pay | Admitting: Hematology and Oncology

## 2015-10-31 ENCOUNTER — Ambulatory Visit (HOSPITAL_BASED_OUTPATIENT_CLINIC_OR_DEPARTMENT_OTHER): Payer: 59 | Admitting: Hematology and Oncology

## 2015-10-31 ENCOUNTER — Ambulatory Visit: Payer: 59

## 2015-10-31 VITALS — BP 149/80 | HR 73 | Temp 98.1°F | Resp 18 | Ht 67.5 in

## 2015-10-31 VITALS — BP 152/81 | HR 79 | Resp 20

## 2015-10-31 DIAGNOSIS — D693 Immune thrombocytopenic purpura: Secondary | ICD-10-CM

## 2015-10-31 DIAGNOSIS — Z79899 Other long term (current) drug therapy: Secondary | ICD-10-CM | POA: Diagnosis not present

## 2015-10-31 LAB — CBC WITH DIFFERENTIAL/PLATELET
Basophils Absolute: 0 10*3/uL (ref 0–0.1)
Basophils Relative: 0 %
Eosinophils Absolute: 0 10*3/uL (ref 0–0.7)
Eosinophils Relative: 0 %
HCT: 43.2 % (ref 35.0–47.0)
Hemoglobin: 13.8 g/dL (ref 12.0–16.0)
Lymphocytes Relative: 11 %
Lymphs Abs: 1.5 10*3/uL (ref 1.0–3.6)
MCH: 28.9 pg (ref 26.0–34.0)
MCHC: 31.9 g/dL — ABNORMAL LOW (ref 32.0–36.0)
MCV: 90.6 fL (ref 80.0–100.0)
Monocytes Absolute: 0.7 10*3/uL (ref 0.2–0.9)
Monocytes Relative: 5 %
Neutro Abs: 11.7 10*3/uL — ABNORMAL HIGH (ref 1.4–6.5)
Neutrophils Relative %: 84 %
Platelets: 175 10*3/uL (ref 150–440)
RBC: 4.77 MIL/uL (ref 3.80–5.20)
RDW: 14.3 % (ref 11.5–14.5)
WBC: 14 10*3/uL — ABNORMAL HIGH (ref 3.6–11.0)

## 2015-10-31 LAB — COMPREHENSIVE METABOLIC PANEL
ALT: 20 U/L (ref 14–54)
AST: 16 U/L (ref 15–41)
Albumin: 3.6 g/dL (ref 3.5–5.0)
Alkaline Phosphatase: 64 U/L (ref 38–126)
Anion gap: 6 (ref 5–15)
BUN: 25 mg/dL — ABNORMAL HIGH (ref 6–20)
CO2: 28 mmol/L (ref 22–32)
Calcium: 8.6 mg/dL — ABNORMAL LOW (ref 8.9–10.3)
Chloride: 105 mmol/L (ref 101–111)
Creatinine, Ser: 1.08 mg/dL — ABNORMAL HIGH (ref 0.44–1.00)
GFR calc Af Amer: >60 mL/min (ref >60)
GFR calc non Af Amer: 56 mL/min — ABNORMAL LOW (ref >60)
Glucose, Bld: 107 mg/dL — ABNORMAL HIGH (ref 65–99)
Potassium: 3.9 mmol/L (ref 3.5–5.1)
Sodium: 139 mmol/L (ref 135–145)
Total Bilirubin: 0.8 mg/dL (ref 0.3–1.2)
Total Protein: 6.9 g/dL (ref 6.5–8.1)

## 2015-10-31 MED ORDER — ACETAMINOPHEN 325 MG PO TABS
650.0000 mg | ORAL_TABLET | Freq: Once | ORAL | Status: AC
  Administered 2015-10-31: 650 mg via ORAL
  Filled 2015-10-31: qty 2

## 2015-10-31 MED ORDER — SODIUM CHLORIDE 0.9 % IV SOLN
375.0000 mg/m2 | Freq: Once | INTRAVENOUS | Status: AC
  Administered 2015-10-31: 600 mg via INTRAVENOUS
  Filled 2015-10-31: qty 60

## 2015-10-31 MED ORDER — SODIUM CHLORIDE 0.9 % IV SOLN: 375.0000 mg/m2 | Freq: Once | INTRAVENOUS | Status: DC

## 2015-10-31 MED ORDER — SODIUM CHLORIDE 0.9 % IV SOLN
Freq: Once | INTRAVENOUS | Status: AC
  Administered 2015-10-31: 10:00:00 via INTRAVENOUS
  Filled 2015-10-31: qty 1000

## 2015-10-31 MED ORDER — DIPHENHYDRAMINE HCL 25 MG PO CAPS
50.0000 mg | ORAL_CAPSULE | Freq: Once | ORAL | Status: AC
  Administered 2015-10-31: 50 mg via ORAL
  Filled 2015-10-31: qty 2
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
## 2015-11-07 ENCOUNTER — Encounter: Payer: Self-pay | Admitting: Internal Medicine

## 2015-11-07 ENCOUNTER — Inpatient Hospital Stay (HOSPITAL_BASED_OUTPATIENT_CLINIC_OR_DEPARTMENT_OTHER): Payer: 59 | Admitting: Family Medicine

## 2015-11-07 ENCOUNTER — Inpatient Hospital Stay: Payer: 59

## 2015-11-07 VITALS — BP 144/74 | HR 81 | Resp 18

## 2015-11-07 VITALS — BP 152/74 | HR 63 | Temp 97.4°F | Wt 145.7 lb

## 2015-11-07 DIAGNOSIS — D693 Immune thrombocytopenic purpura: Secondary | ICD-10-CM

## 2015-11-07 DIAGNOSIS — Z79899 Other long term (current) drug therapy: Secondary | ICD-10-CM | POA: Diagnosis not present

## 2015-11-07 LAB — CBC WITH DIFFERENTIAL/PLATELET
Basophils Absolute: 0 10*3/uL (ref 0–0.1)
Basophils Relative: 0 %
Eosinophils Absolute: 0 10*3/uL (ref 0–0.7)
Eosinophils Relative: 0 %
HCT: 43.9 % (ref 35.0–47.0)
Hemoglobin: 14.2 g/dL (ref 12.0–16.0)
Lymphocytes Relative: 5 %
Lymphs Abs: 0.7 10*3/uL — ABNORMAL LOW (ref 1.0–3.6)
MCH: 29.4 pg (ref 26.0–34.0)
MCHC: 32.3 g/dL (ref 32.0–36.0)
MCV: 91.1 fL (ref 80.0–100.0)
Monocytes Absolute: 0.4 10*3/uL (ref 0.2–0.9)
Monocytes Relative: 3 %
Neutro Abs: 12.3 10*3/uL — ABNORMAL HIGH (ref 1.4–6.5)
Neutrophils Relative %: 92 %
Platelets: 159 10*3/uL (ref 150–440)
RBC: 4.82 MIL/uL (ref 3.80–5.20)
RDW: 14.4 % (ref 11.5–14.5)
WBC: 13.5 10*3/uL — ABNORMAL HIGH (ref 3.6–11.0)

## 2015-11-07 MED ORDER — SODIUM CHLORIDE 0.9 % IV SOLN: 375.0000 mg/m2 | Freq: Once | INTRAVENOUS | Status: DC

## 2015-11-07 MED ORDER — ACETAMINOPHEN 325 MG PO TABS
650.0000 mg | ORAL_TABLET | Freq: Once | ORAL | Status: AC
  Administered 2015-11-07: 650 mg via ORAL
  Filled 2015-11-07: qty 2

## 2015-11-07 MED ORDER — DIPHENHYDRAMINE HCL 25 MG PO CAPS
50.0000 mg | ORAL_CAPSULE | Freq: Once | ORAL | Status: AC
  Administered 2015-11-07: 50 mg via ORAL
  Filled 2015-11-07: qty 2

## 2015-11-07 MED ORDER — SODIUM CHLORIDE 0.9 % IV SOLN
600.0000 mg | Freq: Once | INTRAVENOUS | Status: AC
  Administered 2015-11-07: 600 mg via INTRAVENOUS
  Filled 2015-11-07: qty 60

## 2015-11-07 MED ORDER — SODIUM CHLORIDE 0.9 % IV SOLN
Freq: Once | INTRAVENOUS | Status: AC
  Administered 2015-11-07: 10:00:00 via INTRAVENOUS
  Filled 2015-11-07: qty 1000

## 2015-11-07 NOTE — Progress Notes (Signed)
Here for next infusion of Rituxan for ITP. States she is having mild hair loss. She experiences sweating," heat flashes" from head down. Energy is okay. Appetite is okay. She is sleeping. Bowels normal. Currently taking 40mg  prednisone. NP decreased her to 30 mg daily.
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
## 2015-11-14 ENCOUNTER — Inpatient Hospital Stay (HOSPITAL_BASED_OUTPATIENT_CLINIC_OR_DEPARTMENT_OTHER): Payer: 59 | Admitting: Hematology and Oncology

## 2015-11-14 ENCOUNTER — Encounter: Payer: Self-pay | Admitting: Hematology and Oncology

## 2015-11-14 ENCOUNTER — Inpatient Hospital Stay: Payer: 59

## 2015-11-14 VITALS — BP 178/80 | HR 84 | Temp 97.1°F | Resp 18

## 2015-11-14 VITALS — BP 147/83 | HR 72 | Temp 96.3°F | Resp 18 | Ht 67.5 in | Wt 149.0 lb

## 2015-11-14 DIAGNOSIS — Z79899 Other long term (current) drug therapy: Secondary | ICD-10-CM

## 2015-11-14 DIAGNOSIS — D693 Immune thrombocytopenic purpura: Secondary | ICD-10-CM | POA: Diagnosis not present

## 2015-11-14 LAB — CBC WITH DIFFERENTIAL/PLATELET
Basophils Absolute: 0.1 10*3/uL (ref 0–0.1)
Basophils Relative: 1 %
Eosinophils Absolute: 0 10*3/uL (ref 0–0.7)
Eosinophils Relative: 0 %
HCT: 44.3 % (ref 35.0–47.0)
Hemoglobin: 14.4 g/dL (ref 12.0–16.0)
Lymphocytes Relative: 8 %
Lymphs Abs: 0.8 10*3/uL — ABNORMAL LOW (ref 1.0–3.6)
MCH: 29.8 pg (ref 26.0–34.0)
MCHC: 32.6 g/dL (ref 32.0–36.0)
MCV: 91.3 fL (ref 80.0–100.0)
Monocytes Absolute: 0.4 10*3/uL (ref 0.2–0.9)
Monocytes Relative: 4 %
Neutro Abs: 8.1 10*3/uL — ABNORMAL HIGH (ref 1.4–6.5)
Neutrophils Relative %: 87 %
Platelets: 167 10*3/uL (ref 150–440)
RBC: 4.85 MIL/uL (ref 3.80–5.20)
RDW: 14.7 % — ABNORMAL HIGH (ref 11.5–14.5)
WBC: 9.3 10*3/uL (ref 3.6–11.0)

## 2015-11-14 MED ORDER — SODIUM CHLORIDE 0.9 % IV SOLN
600.0000 mg | Freq: Once | INTRAVENOUS | Status: AC
  Administered 2015-11-14: 600 mg via INTRAVENOUS
  Filled 2015-11-14: qty 60

## 2015-11-14 MED ORDER — SODIUM CHLORIDE 0.9 % IV SOLN
Freq: Once | INTRAVENOUS | Status: AC
  Administered 2015-11-14: 09:00:00 via INTRAVENOUS
  Filled 2015-11-14: qty 1000

## 2015-11-14 MED ORDER — SODIUM CHLORIDE 0.9 % IV SOLN: 375.0000 mg/m2 | Freq: Once | INTRAVENOUS | Status: DC

## 2015-11-14 MED ORDER — ACETAMINOPHEN 325 MG PO TABS
650.0000 mg | ORAL_TABLET | Freq: Once | ORAL | Status: AC
  Administered 2015-11-14: 650 mg via ORAL
  Filled 2015-11-14: qty 2

## 2015-11-14 MED ORDER — DIPHENHYDRAMINE HCL 25 MG PO CAPS
50.0000 mg | ORAL_CAPSULE | Freq: Once | ORAL | Status: AC
  Administered 2015-11-14: 50 mg via ORAL
  Filled 2015-11-14: qty 2

## 2015-11-14 NOTE — Progress Notes (Signed)
Pt reports having sweating during day hours instead of at nighttime now, difficulty falling asleep or takes longer.  Pt reports she is now 30 mg of prednisone.
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
## 2015-11-21 ENCOUNTER — Encounter: Payer: Self-pay | Admitting: *Deleted

## 2015-11-21 ENCOUNTER — Inpatient Hospital Stay: Payer: 59 | Attending: Hematology and Oncology

## 2015-11-21 DIAGNOSIS — Z79899 Other long term (current) drug therapy: Secondary | ICD-10-CM | POA: Insufficient documentation

## 2015-11-21 DIAGNOSIS — D693 Immune thrombocytopenic purpura: Secondary | ICD-10-CM | POA: Insufficient documentation

## 2015-11-21 DIAGNOSIS — M329 Systemic lupus erythematosus, unspecified: Secondary | ICD-10-CM | POA: Diagnosis not present

## 2015-11-21 LAB — CBC WITH DIFFERENTIAL/PLATELET
Basophils Absolute: 0 10*3/uL (ref 0–0.1)
Basophils Relative: 1 %
Eosinophils Absolute: 0.1 10*3/uL (ref 0–0.7)
Eosinophils Relative: 1 %
HCT: 43.8 % (ref 35.0–47.0)
Hemoglobin: 14.3 g/dL (ref 12.0–16.0)
Lymphocytes Relative: 10 %
Lymphs Abs: 0.6 10*3/uL — ABNORMAL LOW (ref 1.0–3.6)
MCH: 29.5 pg (ref 26.0–34.0)
MCHC: 32.6 g/dL (ref 32.0–36.0)
MCV: 90.7 fL (ref 80.0–100.0)
Monocytes Absolute: 0.4 10*3/uL (ref 0.2–0.9)
Monocytes Relative: 6 %
Neutro Abs: 5.2 10*3/uL (ref 1.4–6.5)
Neutrophils Relative %: 82 %
Platelets: 153 10*3/uL (ref 150–440)
RBC: 4.84 MIL/uL (ref 3.80–5.20)
RDW: 14.4 % (ref 11.5–14.5)
WBC: 6.3 10*3/uL (ref 3.6–11.0)

## 2015-11-21 NOTE — Progress Notes (Unsigned)
In clinic for lab only appt; Platelets 153 today. Pt is tapering down on her prednisone. Instructed to begin alternating 5mg  of prednisone one day alterating with 10 mg prednisone every other day until patient returns next week for lab work. Pt understands new instructions.

## 2015-11-28 ENCOUNTER — Encounter: Payer: Self-pay | Admitting: *Deleted

## 2015-11-28 ENCOUNTER — Other Ambulatory Visit: Payer: Self-pay | Admitting: *Deleted

## 2015-11-28 ENCOUNTER — Inpatient Hospital Stay: Payer: 59

## 2015-11-28 DIAGNOSIS — D693 Immune thrombocytopenic purpura: Secondary | ICD-10-CM

## 2015-11-28 LAB — CBC WITH DIFFERENTIAL/PLATELET
Basophils Absolute: 0 10*3/uL (ref 0–0.1)
Basophils Relative: 1 %
Eosinophils Absolute: 0 10*3/uL (ref 0–0.7)
Eosinophils Relative: 1 %
HCT: 43.8 % (ref 35.0–47.0)
Hemoglobin: 14.4 g/dL (ref 12.0–16.0)
Lymphocytes Relative: 20 %
Lymphs Abs: 0.9 10*3/uL — ABNORMAL LOW (ref 1.0–3.6)
MCH: 29.6 pg (ref 26.0–34.0)
MCHC: 32.9 g/dL (ref 32.0–36.0)
MCV: 90.1 fL (ref 80.0–100.0)
Monocytes Absolute: 0.4 10*3/uL (ref 0.2–0.9)
Monocytes Relative: 9 %
Neutro Abs: 3.1 10*3/uL (ref 1.4–6.5)
Neutrophils Relative %: 69 %
Platelets: 168 10*3/uL (ref 150–440)
RBC: 4.86 MIL/uL (ref 3.80–5.20)
RDW: 13.9 % (ref 11.5–14.5)
WBC: 4.4 10*3/uL (ref 3.6–11.0)

## 2015-11-28 MED ORDER — PREDNISONE 10 MG PO TABS: 5.0000 mg | ORAL_TABLET | ORAL | Status: DC

## 2015-11-28 NOTE — Progress Notes (Signed)
Per Dr. Mike Gip pt needs to take 5mg  prednisone every other day and that does not need to take prednisone the day before or day of lab work (last dose of prednisone should be on Monday 4/17 prior to lab work on 4/19). Pt instructed of MD instructions and verbalized understanding.

## 2015-12-05 ENCOUNTER — Inpatient Hospital Stay: Payer: 59

## 2015-12-05 DIAGNOSIS — D693 Immune thrombocytopenic purpura: Secondary | ICD-10-CM | POA: Diagnosis not present

## 2015-12-05 DIAGNOSIS — E273 Drug-induced adrenocortical insufficiency: Secondary | ICD-10-CM | POA: Insufficient documentation

## 2015-12-05 LAB — CBC WITH DIFFERENTIAL/PLATELET
Basophils Absolute: 0 10*3/uL (ref 0–0.1)
Basophils Relative: 0 %
Eosinophils Absolute: 0.1 10*3/uL (ref 0–0.7)
Eosinophils Relative: 2 %
HCT: 43.7 % (ref 35.0–47.0)
Hemoglobin: 14.6 g/dL (ref 12.0–16.0)
Lymphocytes Relative: 19 %
Lymphs Abs: 0.8 10*3/uL — ABNORMAL LOW (ref 1.0–3.6)
MCH: 30 pg (ref 26.0–34.0)
MCHC: 33.3 g/dL (ref 32.0–36.0)
MCV: 90.2 fL (ref 80.0–100.0)
Monocytes Absolute: 0.4 10*3/uL (ref 0.2–0.9)
Monocytes Relative: 10 %
Neutro Abs: 2.9 10*3/uL (ref 1.4–6.5)
Neutrophils Relative %: 69 %
Platelets: 176 10*3/uL (ref 150–440)
RBC: 4.85 MIL/uL (ref 3.80–5.20)
RDW: 14.2 % (ref 11.5–14.5)
WBC: 4.1 10*3/uL (ref 3.6–11.0)

## 2015-12-05 LAB — CORTISOL: Cortisol, Plasma: 4.9 ug/dL

## 2015-12-06 ENCOUNTER — Telehealth: Payer: Self-pay | Admitting: *Deleted

## 2015-12-06 DIAGNOSIS — D693 Immune thrombocytopenic purpura: Secondary | ICD-10-CM

## 2015-12-06 NOTE — Telephone Encounter (Signed)
-----   Message from Lequita Asal, MD sent at 12/06/2015  2:57 AM EDT ----- Regarding: Cortisol level  Please call patient.  Cortisol level is low.  Continue steroids.  Recheck next week.  Make sure steroids not taken for 48 hours prior to test to ensure accuracy.  M  ----- Message -----    From: Lab In [place]: 12/05/2015   9:51 AM      To: Lequita Asal, MD

## 2015-12-06 NOTE — Telephone Encounter (Signed)
Called pt and made her aware of cortisol level low and she states that she did stop the prednisone on Monday before she had the blood level drawn on wed. Which was over 48 hours.  I told her that she was suppose to start back with steroids after lab drawn but she did not know she was suppose to start back on steroids.  She was on prednisone 5 mg every other day.  Spoke to [place] and she wants her to start back on prednisone 5 mg every other day starting today.  Then she already has appt next wed. For labs and see md and I will add another cortisol level on at that time.  She is agreeable to this and she should take 5 mg of steroid today, sat, mon. And then on wed. She was told to bring it with her to the cancer center and take it after she has her blood work done and she will do that.

## 2015-12-07 ENCOUNTER — Other Ambulatory Visit: Payer: Self-pay | Admitting: Hematology and Oncology

## 2015-12-12 ENCOUNTER — Inpatient Hospital Stay (HOSPITAL_BASED_OUTPATIENT_CLINIC_OR_DEPARTMENT_OTHER): Payer: 59 | Admitting: Hematology and Oncology

## 2015-12-12 ENCOUNTER — Inpatient Hospital Stay: Payer: 59

## 2015-12-12 VITALS — BP 129/84 | HR 80 | Temp 98.0°F | Wt 148.6 lb

## 2015-12-12 DIAGNOSIS — D693 Immune thrombocytopenic purpura: Secondary | ICD-10-CM

## 2015-12-12 DIAGNOSIS — M329 Systemic lupus erythematosus, unspecified: Secondary | ICD-10-CM

## 2015-12-12 DIAGNOSIS — D696 Thrombocytopenia, unspecified: Secondary | ICD-10-CM

## 2015-12-12 DIAGNOSIS — Z79899 Other long term (current) drug therapy: Secondary | ICD-10-CM | POA: Diagnosis not present

## 2015-12-12 LAB — CBC WITH DIFFERENTIAL/PLATELET
Basophils Absolute: 0 10*3/uL (ref 0–0.1)
Basophils Relative: 1 %
Eosinophils Absolute: 0.2 10*3/uL (ref 0–0.7)
Eosinophils Relative: 4 %
HCT: 42.2 % (ref 35.0–47.0)
Hemoglobin: 14.1 g/dL (ref 12.0–16.0)
Lymphocytes Relative: 16 %
Lymphs Abs: 0.8 10*3/uL — ABNORMAL LOW (ref 1.0–3.6)
MCH: 29.8 pg (ref 26.0–34.0)
MCHC: 33.3 g/dL (ref 32.0–36.0)
MCV: 89.4 fL (ref 80.0–100.0)
Monocytes Absolute: 0.4 10*3/uL (ref 0.2–0.9)
Monocytes Relative: 8 %
Neutro Abs: 3.8 10*3/uL (ref 1.4–6.5)
Neutrophils Relative %: 71 %
Platelets: 170 10*3/uL (ref 150–440)
RBC: 4.73 MIL/uL (ref 3.80–5.20)
RDW: 14 % (ref 11.5–14.5)
WBC: 5.3 10*3/uL (ref 3.6–11.0)
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
## 2015-12-12 NOTE — Progress Notes (Signed)
Patient here for treatment follow up no complaints at this time.

## 2015-12-13 ENCOUNTER — Telehealth: Payer: Self-pay

## 2015-12-13 LAB — CORTISOL: Cortisol, Plasma: 5.5 ug/dL

## 2015-12-13 NOTE — Telephone Encounter (Signed)
Mrs. Bott called wanting her Cortisol level results, called [facility] and informed that this will not be resulted until tomorrow. Called pt and left message on vm informing her that we would call her tomorrow with results or she could call us.

## 2015-12-14 ENCOUNTER — Other Ambulatory Visit: Payer: Self-pay | Admitting: *Deleted

## 2015-12-14 ENCOUNTER — Telehealth: Payer: Self-pay | Admitting: *Deleted

## 2015-12-14 DIAGNOSIS — D696 Thrombocytopenia, unspecified: Secondary | ICD-10-CM

## 2015-12-14 NOTE — Telephone Encounter (Signed)
-----   Message from Lequita Asal, MD sent at 12/14/2015  2:51 AM EDT ----- Regarding: cortisol still too low  Please call patient  Try again next week  M  ----- Message -----    From: [facility] [place]: 12/12/2015  10:50 AM      To: Lequita Asal, MD

## 2015-12-14 NOTE — Telephone Encounter (Signed)
Called pt at [phone] And let her know the level corticol came up but still not in the section that we can come off prednisone.  Will try again next week and she would like to come wed in [place] and I told her that she has to be off prednisone for 2 days so if wed is the day to take her prednisone she has to wait until after her lab check to have it done.she is agreeable and will be there lab only 11:15 on next wed in [place].  I have snet message to laura in [place] to add her on

## 2015-12-19 ENCOUNTER — Inpatient Hospital Stay: Payer: 59 | Attending: Hematology and Oncology

## 2015-12-19 DIAGNOSIS — Z79899 Other long term (current) drug therapy: Secondary | ICD-10-CM | POA: Insufficient documentation

## 2015-12-19 DIAGNOSIS — D696 Thrombocytopenia, unspecified: Secondary | ICD-10-CM

## 2015-12-19 DIAGNOSIS — Z9221 Personal history of antineoplastic chemotherapy: Secondary | ICD-10-CM | POA: Diagnosis not present

## 2015-12-19 DIAGNOSIS — D693 Immune thrombocytopenic purpura: Secondary | ICD-10-CM | POA: Diagnosis present

## 2015-12-19 DIAGNOSIS — M329 Systemic lupus erythematosus, unspecified: Secondary | ICD-10-CM | POA: Diagnosis not present

## 2015-12-20 LAB — CORTISOL: Cortisol, Plasma: 4.8 ug/dL

## 2015-12-21 ENCOUNTER — Telehealth: Payer: Self-pay

## 2015-12-21 NOTE — Telephone Encounter (Signed)
Called and left vm on pts cell phone that per MD please continue same dose of prednisone cortisol level remains low.  Left phone number for pt to call back if she has any concerns.

## 2015-12-26 ENCOUNTER — Inpatient Hospital Stay: Payer: 59

## 2015-12-26 ENCOUNTER — Other Ambulatory Visit: Payer: Self-pay | Admitting: *Deleted

## 2015-12-26 ENCOUNTER — Telehealth: Payer: Self-pay | Admitting: *Deleted

## 2015-12-26 DIAGNOSIS — D696 Thrombocytopenia, unspecified: Secondary | ICD-10-CM

## 2015-12-26 DIAGNOSIS — D693 Immune thrombocytopenic purpura: Secondary | ICD-10-CM

## 2015-12-26 LAB — CBC WITH DIFFERENTIAL/PLATELET
Basophils Absolute: 0 10*3/uL (ref 0–0.1)
Basophils Relative: 1 %
Eosinophils Absolute: 0.8 10*3/uL — ABNORMAL HIGH (ref 0–0.7)
Eosinophils Relative: 12 %
HCT: 41.6 % (ref 35.0–47.0)
Hemoglobin: 13.7 g/dL (ref 12.0–16.0)
Lymphocytes Relative: 15 %
Lymphs Abs: 1 10*3/uL (ref 1.0–3.6)
MCH: 29.7 pg (ref 26.0–34.0)
MCHC: 32.9 g/dL (ref 32.0–36.0)
MCV: 90.1 fL (ref 80.0–100.0)
Monocytes Absolute: 0.6 10*3/uL (ref 0.2–0.9)
Monocytes Relative: 9 %
Neutro Abs: 3.8 10*3/uL (ref 1.4–6.5)
Neutrophils Relative %: 63 %
Platelets: 169 10*3/uL (ref 150–440)
RBC: 4.62 MIL/uL (ref 3.80–5.20)
RDW: 13.9 % (ref 11.5–14.5)
WBC: 6.2 10*3/uL (ref 3.6–11.0)

## 2015-12-26 NOTE — Telephone Encounter (Signed)
Told pt that plt was 169 and we did not check cortisol level this week but will check it next week. Reminded about prednisone 5 mg every other day and make sure she is off prednisone for 48 hours when getting cortisol level done. Pt agreeable to plan

## 2016-01-02 ENCOUNTER — Inpatient Hospital Stay: Payer: 59

## 2016-01-02 DIAGNOSIS — D693 Immune thrombocytopenic purpura: Secondary | ICD-10-CM | POA: Diagnosis not present

## 2016-01-02 LAB — CBC WITH DIFFERENTIAL/PLATELET
Basophils Absolute: 0 10*3/uL (ref 0–0.1)
Basophils Relative: 1 %
Eosinophils Absolute: 0.9 10*3/uL — ABNORMAL HIGH (ref 0–0.7)
Eosinophils Relative: 15 %
HCT: 41.9 % (ref 35.0–47.0)
Hemoglobin: 13.7 g/dL (ref 12.0–16.0)
Lymphocytes Relative: 26 %
Lymphs Abs: 1.6 10*3/uL (ref 1.0–3.6)
MCH: 29.5 pg (ref 26.0–34.0)
MCHC: 32.7 g/dL (ref 32.0–36.0)
MCV: 90.4 fL (ref 80.0–100.0)
Monocytes Absolute: 0.8 10*3/uL (ref 0.2–0.9)
Monocytes Relative: 13 %
Neutro Abs: 2.8 10*3/uL (ref 1.4–6.5)
Neutrophils Relative %: 45 %
Platelets: 184 10*3/uL (ref 150–440)
RBC: 4.64 MIL/uL (ref 3.80–5.20)
RDW: 14 % (ref 11.5–14.5)
WBC: 6.1 10*3/uL (ref 3.6–11.0)

## 2016-01-02 LAB — CORTISOL: Cortisol, Plasma: 5 ug/dL

## 2016-01-03 ENCOUNTER — Telehealth: Payer: Self-pay

## 2016-01-03 ENCOUNTER — Other Ambulatory Visit: Payer: Self-pay

## 2016-01-03 DIAGNOSIS — D693 Immune thrombocytopenic purpura: Secondary | ICD-10-CM

## 2016-01-03 NOTE — Telephone Encounter (Signed)
Pt would like to come next week for her Cortisol check again.  Pt away to hold prednisone for 48 hours.  No other concerns.

## 2016-01-03 NOTE — Telephone Encounter (Signed)
Called pt left vm for pt to call me back regarding medication and labs to be scheduled.

## 2016-01-09 ENCOUNTER — Inpatient Hospital Stay: Payer: 59

## 2016-01-09 ENCOUNTER — Inpatient Hospital Stay (HOSPITAL_BASED_OUTPATIENT_CLINIC_OR_DEPARTMENT_OTHER): Payer: 59 | Admitting: Hematology and Oncology

## 2016-01-09 ENCOUNTER — Encounter: Payer: Self-pay | Admitting: Hematology and Oncology

## 2016-01-09 VITALS — BP 147/81 | HR 78 | Temp 97.5°F | Resp 18 | Ht 67.5 in | Wt 148.8 lb

## 2016-01-09 DIAGNOSIS — D693 Immune thrombocytopenic purpura: Secondary | ICD-10-CM

## 2016-01-09 DIAGNOSIS — Z9221 Personal history of antineoplastic chemotherapy: Secondary | ICD-10-CM | POA: Diagnosis not present

## 2016-01-09 DIAGNOSIS — Z79899 Other long term (current) drug therapy: Secondary | ICD-10-CM | POA: Diagnosis not present

## 2016-01-09 DIAGNOSIS — D696 Thrombocytopenia, unspecified: Secondary | ICD-10-CM

## 2016-01-09 DIAGNOSIS — M329 Systemic lupus erythematosus, unspecified: Secondary | ICD-10-CM | POA: Diagnosis not present

## 2016-01-09 DIAGNOSIS — E273 Drug-induced adrenocortical insufficiency: Secondary | ICD-10-CM

## 2016-01-09 LAB — CBC WITH DIFFERENTIAL/PLATELET
Basophils Absolute: 0 10*3/uL (ref 0–0.1)
Basophils Relative: 1 %
Eosinophils Absolute: 0.6 10*3/uL (ref 0–0.7)
Eosinophils Relative: 9 %
HCT: 41.8 % (ref 35.0–47.0)
Hemoglobin: 13.7 g/dL (ref 12.0–16.0)
Lymphocytes Relative: 20 %
Lymphs Abs: 1.2 10*3/uL (ref 1.0–3.6)
MCH: 29.7 pg (ref 26.0–34.0)
MCHC: 32.8 g/dL (ref 32.0–36.0)
MCV: 90.4 fL (ref 80.0–100.0)
Monocytes Absolute: 1 10*3/uL — ABNORMAL HIGH (ref 0.2–0.9)
Monocytes Relative: 15 %
Neutro Abs: 3.5 10*3/uL (ref 1.4–6.5)
Neutrophils Relative %: 55 %
Platelets: 204 10*3/uL (ref 150–440)
RBC: 4.62 MIL/uL (ref 3.80–5.20)
RDW: 14 % (ref 11.5–14.5)
WBC: 6.2 10*3/uL (ref 3.6–11.0)

## 2016-01-09 LAB — CORTISOL: Cortisol, Plasma: 8.1 ug/dL

## 2016-01-09 NOTE — Progress Notes (Signed)
No changes other than a clear blister/gel on right eye.  No pain.  No other concerns
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
## 2016-01-10 ENCOUNTER — Telehealth: Payer: Self-pay

## 2016-01-10 NOTE — Telephone Encounter (Signed)
Called pt per MD Cortisol level is good and ok to stop prednisone.  Pt verbalizes an understanding and verbalizes gratitude.  No other concerns noted

## 2016-01-11 ENCOUNTER — Encounter: Payer: Self-pay | Admitting: Hematology and Oncology

## 2016-02-06 ENCOUNTER — Inpatient Hospital Stay: Payer: 59 | Attending: Hematology and Oncology

## 2016-02-06 DIAGNOSIS — D693 Immune thrombocytopenic purpura: Secondary | ICD-10-CM | POA: Diagnosis not present

## 2016-02-06 LAB — CBC WITH DIFFERENTIAL/PLATELET
Basophils Absolute: 0 10*3/uL (ref 0–0.1)
Basophils Relative: 1 %
Eosinophils Absolute: 0.3 10*3/uL (ref 0–0.7)
Eosinophils Relative: 8 %
HCT: 41.8 % (ref 35.0–47.0)
Hemoglobin: 13.6 g/dL (ref 12.0–16.0)
Lymphocytes Relative: 22 %
Lymphs Abs: 1 10*3/uL (ref 1.0–3.6)
MCH: 29.4 pg (ref 26.0–34.0)
MCHC: 32.6 g/dL (ref 32.0–36.0)
MCV: 90.3 fL (ref 80.0–100.0)
Monocytes Absolute: 0.4 10*3/uL (ref 0.2–0.9)
Monocytes Relative: 9 %
Neutro Abs: 2.6 10*3/uL (ref 1.4–6.5)
Neutrophils Relative %: 60 %
Platelets: 168 10*3/uL (ref 150–440)
RBC: 4.63 MIL/uL (ref 3.80–5.20)
RDW: 13.8 % (ref 11.5–14.5)
WBC: 4.3 10*3/uL (ref 3.6–11.0)

## 2016-02-06 LAB — COMPREHENSIVE METABOLIC PANEL
ALT: 16 U/L (ref 14–54)
AST: 19 U/L (ref 15–41)
Albumin: 4.1 g/dL (ref 3.5–5.0)
Alkaline Phosphatase: 77 U/L (ref 38–126)
Anion gap: 6 (ref 5–15)
BUN: 12 mg/dL (ref 6–20)
CO2: 31 mmol/L (ref 22–32)
Calcium: 9.3 mg/dL (ref 8.9–10.3)
Chloride: 101 mmol/L (ref 101–111)
Creatinine, Ser: 0.72 mg/dL (ref 0.44–1.00)
GFR calc Af Amer: >60 mL/min (ref >60)
GFR calc non Af Amer: >60 mL/min (ref >60)
Glucose, Bld: 117 mg/dL — ABNORMAL HIGH (ref 65–99)
Potassium: 4.4 mmol/L (ref 3.5–5.1)
Sodium: 138 mmol/L (ref 135–145)
Total Bilirubin: 1 mg/dL (ref 0.3–1.2)
Total Protein: 7.3 g/dL (ref 6.5–8.1)

## 2016-02-07 LAB — C3 COMPLEMENT: C3 Complement: 92 mg/dL (ref 82–167)

## 2016-02-07 LAB — C4 COMPLEMENT: Complement C4, Body Fluid: 23 mg/dL (ref 14–44)

## 2016-02-07 LAB — ANTI-DNA ANTIBODY, DOUBLE-STRANDED: ds DNA Ab: 26 IU/mL — ABNORMAL HIGH (ref 0–9)

## 2016-03-12 ENCOUNTER — Inpatient Hospital Stay (HOSPITAL_BASED_OUTPATIENT_CLINIC_OR_DEPARTMENT_OTHER): Payer: 59 | Admitting: Hematology and Oncology

## 2016-03-12 ENCOUNTER — Encounter: Payer: Self-pay | Admitting: Hematology and Oncology

## 2016-03-12 ENCOUNTER — Other Ambulatory Visit: Payer: Self-pay | Admitting: *Deleted

## 2016-03-12 ENCOUNTER — Inpatient Hospital Stay: Payer: 59 | Attending: Hematology and Oncology

## 2016-03-12 VITALS — BP 115/78 | HR 71 | Temp 98.4°F | Resp 18 | Wt 144.0 lb

## 2016-03-12 DIAGNOSIS — M329 Systemic lupus erythematosus, unspecified: Secondary | ICD-10-CM | POA: Diagnosis not present

## 2016-03-12 DIAGNOSIS — Z79899 Other long term (current) drug therapy: Secondary | ICD-10-CM

## 2016-03-12 DIAGNOSIS — D693 Immune thrombocytopenic purpura: Secondary | ICD-10-CM

## 2016-03-12 LAB — CBC WITH DIFFERENTIAL/PLATELET
Basophils Absolute: 0 10*3/uL (ref 0–0.1)
Basophils Relative: 1 %
Eosinophils Absolute: 0.4 10*3/uL (ref 0–0.7)
Eosinophils Relative: 7 %
HCT: 42.9 % (ref 35.0–47.0)
Hemoglobin: 13.9 g/dL (ref 12.0–16.0)
Lymphocytes Relative: 22 %
Lymphs Abs: 1.2 10*3/uL (ref 1.0–3.6)
MCH: 29 pg (ref 26.0–34.0)
MCHC: 32.5 g/dL (ref 32.0–36.0)
MCV: 89.4 fL (ref 80.0–100.0)
Monocytes Absolute: 0.6 10*3/uL (ref 0.2–0.9)
Monocytes Relative: 12 %
Neutro Abs: 3.2 10*3/uL (ref 1.4–6.5)
Neutrophils Relative %: 58 %
Platelets: 177 10*3/uL (ref 150–440)
RBC: 4.8 MIL/uL (ref 3.80–5.20)
RDW: 13.2 % (ref 11.5–14.5)
WBC: 5.5 10*3/uL (ref 3.6–11.0)

## 2016-03-12 NOTE — Progress Notes (Signed)
Patient is here for follow up, she is doing well with no complaints  
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
## 2016-04-09 ENCOUNTER — Other Ambulatory Visit: Payer: Self-pay | Admitting: *Deleted

## 2016-04-09 ENCOUNTER — Telehealth: Payer: Self-pay | Admitting: *Deleted

## 2016-04-09 ENCOUNTER — Inpatient Hospital Stay: Payer: 59 | Attending: Hematology and Oncology

## 2016-04-09 DIAGNOSIS — D693 Immune thrombocytopenic purpura: Secondary | ICD-10-CM | POA: Diagnosis not present

## 2016-04-09 DIAGNOSIS — M329 Systemic lupus erythematosus, unspecified: Secondary | ICD-10-CM

## 2016-04-09 LAB — CBC WITH DIFFERENTIAL/PLATELET
Basophils Absolute: 0 10*3/uL (ref 0–0.1)
Basophils Relative: 1 %
Eosinophils Absolute: 0.3 10*3/uL (ref 0–0.7)
Eosinophils Relative: 5 %
HCT: 41.3 % (ref 35.0–47.0)
Hemoglobin: 13.4 g/dL (ref 12.0–16.0)
Lymphocytes Relative: 18 %
Lymphs Abs: 1 10*3/uL (ref 1.0–3.6)
MCH: 28.6 pg (ref 26.0–34.0)
MCHC: 32.5 g/dL (ref 32.0–36.0)
MCV: 88 fL (ref 80.0–100.0)
Monocytes Absolute: 0.6 10*3/uL (ref 0.2–0.9)
Monocytes Relative: 11 %
Neutro Abs: 3.5 10*3/uL (ref 1.4–6.5)
Neutrophils Relative %: 65 %
Platelets: 175 10*3/uL (ref 150–440)
RBC: 4.7 MIL/uL (ref 3.80–5.20)
RDW: 13.7 % (ref 11.5–14.5)
WBC: 5.4 10*3/uL (ref 3.6–11.0)

## 2016-04-09 NOTE — Telephone Encounter (Signed)
Patient wanted to check and see if her labs for rheumatology .  Mike Gip  had spoke to pt in June and told her she would do the labs every 3 months and then rheumatology will see labs and md notes in computer thru care everywhere.  Spoke to [place] and she said she did tell pt that.  She will need anti-DNA-DS, and C3 complement and C4 complement.  Pt is scheduled for lab only 9/27 and I have added on the above labs for that same date

## 2016-05-14 ENCOUNTER — Telehealth: Payer: Self-pay | Admitting: *Deleted

## 2016-05-14 ENCOUNTER — Inpatient Hospital Stay: Payer: 59 | Attending: Hematology and Oncology

## 2016-05-14 DIAGNOSIS — D693 Immune thrombocytopenic purpura: Secondary | ICD-10-CM | POA: Insufficient documentation

## 2016-05-14 DIAGNOSIS — M329 Systemic lupus erythematosus, unspecified: Secondary | ICD-10-CM

## 2016-05-14 LAB — CBC WITH DIFFERENTIAL/PLATELET
Basophils Absolute: 0 10*3/uL (ref 0–0.1)
Basophils Relative: 1 %
Eosinophils Absolute: 0.3 10*3/uL (ref 0–0.7)
Eosinophils Relative: 6 %
HCT: 42.3 % (ref 35.0–47.0)
Hemoglobin: 13.8 g/dL (ref 12.0–16.0)
Lymphocytes Relative: 17 %
Lymphs Abs: 0.9 10*3/uL — ABNORMAL LOW (ref 1.0–3.6)
MCH: 29.2 pg (ref 26.0–34.0)
MCHC: 32.6 g/dL (ref 32.0–36.0)
MCV: 89.5 fL (ref 80.0–100.0)
Monocytes Absolute: 0.4 10*3/uL (ref 0.2–0.9)
Monocytes Relative: 7 %
Neutro Abs: 3.6 10*3/uL (ref 1.4–6.5)
Neutrophils Relative %: 69 %
Platelets: 160 10*3/uL (ref 150–440)
RBC: 4.73 MIL/uL (ref 3.80–5.20)
RDW: 13.8 % (ref 11.5–14.5)
WBC: 5.2 10*3/uL (ref 3.6–11.0)

## 2016-05-14 NOTE — Telephone Encounter (Signed)
Patient stayed for labs and it was printed off ad given to her and went over for values

## 2016-05-14 NOTE — Telephone Encounter (Signed)
-----   Message from Lequita Asal, MD sent at 05/14/2016 11:30 AM EDT ----- Regarding: Please notify patient of platelet count ;)  M  ----- Message ----- From: Interface, Lab In [place] Sent: 05/14/2016  10:19 AM To: Lequita Asal, MD

## 2016-05-15 LAB — C4 COMPLEMENT: Complement C4, Body Fluid: 25 mg/dL (ref 14–44)

## 2016-05-15 LAB — ANTI-DNA ANTIBODY, DOUBLE-STRANDED: ds DNA Ab: 25 IU/mL — ABNORMAL HIGH (ref 0–9)

## 2016-05-15 LAB — C3 COMPLEMENT: C3 Complement: 96 mg/dL (ref 82–167)

## 2016-05-27 ENCOUNTER — Telehealth: Payer: Self-pay | Admitting: Hematology and Oncology

## 2016-05-27 NOTE — Telephone Encounter (Signed)
Pt checks MyChart to follow her labs. She said this time she doesn't see the ds DNA Ab, C3 Complement, or Complement C4, Body Fluid showing up in her MyChart results. She wants to know if the nurse can fix it so that it shows up again. Thanks.

## 2016-05-27 NOTE — Telephone Encounter (Signed)
Called pt and got her voicemail. I have no way of making them appear or not in my chart.  When I view the lab results it doe show that it does appear in my chart and that I rec; that she log in again and if she can't see the values to call [phone] number on my chart for problems.  She is welcome ot call me in [place] tom to be of any asst.

## 2016-06-11 ENCOUNTER — Inpatient Hospital Stay: Payer: 59 | Attending: Hematology and Oncology

## 2016-06-11 ENCOUNTER — Inpatient Hospital Stay (HOSPITAL_BASED_OUTPATIENT_CLINIC_OR_DEPARTMENT_OTHER): Payer: 59 | Admitting: Hematology and Oncology

## 2016-06-11 ENCOUNTER — Inpatient Hospital Stay: Payer: 59

## 2016-06-11 ENCOUNTER — Encounter: Payer: Self-pay | Admitting: Hematology and Oncology

## 2016-06-11 VITALS — BP 132/75 | HR 18 | Temp 98.4°F | Resp 18 | Wt 145.1 lb

## 2016-06-11 DIAGNOSIS — Z79899 Other long term (current) drug therapy: Secondary | ICD-10-CM | POA: Diagnosis not present

## 2016-06-11 DIAGNOSIS — M329 Systemic lupus erythematosus, unspecified: Secondary | ICD-10-CM | POA: Insufficient documentation

## 2016-06-11 DIAGNOSIS — D693 Immune thrombocytopenic purpura: Secondary | ICD-10-CM | POA: Diagnosis not present

## 2016-06-11 LAB — CBC WITH DIFFERENTIAL/PLATELET
Basophils Absolute: 0 10*3/uL (ref 0–0.1)
Basophils Relative: 1 %
Eosinophils Absolute: 0.3 10*3/uL (ref 0–0.7)
Eosinophils Relative: 6 %
HCT: 40.7 % (ref 35.0–47.0)
Hemoglobin: 13.4 g/dL (ref 12.0–16.0)
Lymphocytes Relative: 25 %
Lymphs Abs: 1.1 10*3/uL (ref 1.0–3.6)
MCH: 29.3 pg (ref 26.0–34.0)
MCHC: 32.9 g/dL (ref 32.0–36.0)
MCV: 89.3 fL (ref 80.0–100.0)
Monocytes Absolute: 0.4 10*3/uL (ref 0.2–0.9)
Monocytes Relative: 9 %
Neutro Abs: 2.7 10*3/uL (ref 1.4–6.5)
Neutrophils Relative %: 59 %
Platelets: 174 10*3/uL (ref 150–440)
RBC: 4.56 MIL/uL (ref 3.80–5.20)
RDW: 14.2 % (ref 11.5–14.5)
WBC: 4.5 10*3/uL (ref 3.6–11.0)
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
## 2016-06-11 NOTE — Progress Notes (Signed)
Patient offers no complaints today. 

## 2016-06-12 ENCOUNTER — Other Ambulatory Visit: Payer: Self-pay | Admitting: *Deleted

## 2016-06-12 DIAGNOSIS — D693 Immune thrombocytopenic purpura: Secondary | ICD-10-CM

## 2016-06-15 ENCOUNTER — Encounter: Payer: Self-pay | Admitting: Hematology and Oncology

## 2016-08-06 ENCOUNTER — Inpatient Hospital Stay: Payer: 59 | Attending: Hematology and Oncology

## 2016-08-06 DIAGNOSIS — Z79899 Other long term (current) drug therapy: Secondary | ICD-10-CM | POA: Insufficient documentation

## 2016-08-06 DIAGNOSIS — M329 Systemic lupus erythematosus, unspecified: Secondary | ICD-10-CM | POA: Insufficient documentation

## 2016-08-06 DIAGNOSIS — D693 Immune thrombocytopenic purpura: Secondary | ICD-10-CM | POA: Insufficient documentation

## 2016-08-06 LAB — CBC WITH DIFFERENTIAL/PLATELET
Basophils Absolute: 0 10*3/uL (ref 0–0.1)
Basophils Relative: 0 %
Eosinophils Absolute: 0.3 10*3/uL (ref 0–0.7)
Eosinophils Relative: 5 %
HCT: 42.6 % (ref 35.0–47.0)
Hemoglobin: 13.9 g/dL (ref 12.0–16.0)
Lymphocytes Relative: 20 %
Lymphs Abs: 1.2 10*3/uL (ref 1.0–3.6)
MCH: 29.1 pg (ref 26.0–34.0)
MCHC: 32.6 g/dL (ref 32.0–36.0)
MCV: 89.2 fL (ref 80.0–100.0)
Monocytes Absolute: 0.5 10*3/uL (ref 0.2–0.9)
Monocytes Relative: 9 %
Neutro Abs: 3.9 10*3/uL (ref 1.4–6.5)
Neutrophils Relative %: 66 %
Platelets: 168 10*3/uL (ref 150–440)
RBC: 4.77 MIL/uL (ref 3.80–5.20)
RDW: 13.5 % (ref 11.5–14.5)
WBC: 5.9 10*3/uL (ref 3.6–11.0)

## 2016-10-01 ENCOUNTER — Other Ambulatory Visit: Payer: Self-pay | Admitting: *Deleted

## 2016-10-01 ENCOUNTER — Telehealth: Payer: Self-pay | Admitting: *Deleted

## 2016-10-01 DIAGNOSIS — D693 Immune thrombocytopenic purpura: Secondary | ICD-10-CM

## 2016-10-01 DIAGNOSIS — M329 Systemic lupus erythematosus, unspecified: Secondary | ICD-10-CM

## 2016-10-01 DIAGNOSIS — D696 Thrombocytopenia, unspecified: Secondary | ICD-10-CM

## 2016-10-01 NOTE — Telephone Encounter (Signed)
Called patient back to confirm that labs were ordered.

## 2016-10-08 ENCOUNTER — Inpatient Hospital Stay: Payer: 59 | Attending: Hematology and Oncology

## 2016-10-08 DIAGNOSIS — D693 Immune thrombocytopenic purpura: Secondary | ICD-10-CM | POA: Diagnosis present

## 2016-10-08 DIAGNOSIS — M329 Systemic lupus erythematosus, unspecified: Secondary | ICD-10-CM

## 2016-10-08 DIAGNOSIS — D696 Thrombocytopenia, unspecified: Secondary | ICD-10-CM

## 2016-10-08 LAB — CBC WITH DIFFERENTIAL/PLATELET
Basophils Absolute: 0 10*3/uL (ref 0–0.1)
Basophils Relative: 1 %
Eosinophils Absolute: 0.2 10*3/uL (ref 0–0.7)
Eosinophils Relative: 4 %
HCT: 41.3 % (ref 35.0–47.0)
Hemoglobin: 13.4 g/dL (ref 12.0–16.0)
Lymphocytes Relative: 28 %
Lymphs Abs: 1.4 10*3/uL (ref 1.0–3.6)
MCH: 29.2 pg (ref 26.0–34.0)
MCHC: 32.5 g/dL (ref 32.0–36.0)
MCV: 89.9 fL (ref 80.0–100.0)
Monocytes Absolute: 0.4 10*3/uL (ref 0.2–0.9)
Monocytes Relative: 8 %
Neutro Abs: 2.8 10*3/uL (ref 1.4–6.5)
Neutrophils Relative %: 59 %
Platelets: 184 10*3/uL (ref 150–440)
RBC: 4.6 MIL/uL (ref 3.80–5.20)
RDW: 14.2 % (ref 11.5–14.5)
WBC: 4.8 10*3/uL (ref 3.6–11.0)

## 2016-10-09 LAB — ANTI-DNA ANTIBODY, DOUBLE-STRANDED: ds DNA Ab: 15 IU/mL — ABNORMAL HIGH (ref 0–9)

## 2016-10-09 LAB — C3 COMPLEMENT: C3 Complement: 103 mg/dL (ref 82–167)

## 2016-10-09 LAB — C4 COMPLEMENT: Complement C4, Body Fluid: 24 mg/dL (ref 14–44)

## 2016-10-20 ENCOUNTER — Telehealth: Payer: Self-pay | Admitting: Hematology and Oncology

## 2016-10-20 NOTE — Telephone Encounter (Signed)
Pt called for lab results. She doesn't see them on [facility]

## 2016-10-20 NOTE — Telephone Encounter (Signed)
Called to give patient lab results, instructed to call with questions message left on machine.

## 2016-10-20 NOTE — Telephone Encounter (Signed)
Attempted to call patient and left message for her to call with questions.

## 2016-10-22 ENCOUNTER — Telehealth: Payer: Self-pay | Admitting: *Deleted

## 2016-10-22 NOTE — Telephone Encounter (Signed)
-----   Message from Secundino Ginger sent at 10/21/2016  1:46 PM EST ----- Regarding: labs This pt called me again and said she got your msg but wants to see her labs. If you can print them tomorrow I will mail them to her. She was in agreement with that.

## 2016-10-22 NOTE — Telephone Encounter (Signed)
[facility] and had them send lab results from 10-08-16.  Copied labs drawn @ [facility] for same date.  Dan Europe mailed copies, per patient's request.

## 2016-10-23 ENCOUNTER — Encounter: Payer: Self-pay | Admitting: Hematology and Oncology

## 2016-10-28 ENCOUNTER — Encounter: Payer: Self-pay | Admitting: Hematology and Oncology

## 2016-10-30 ENCOUNTER — Telehealth: Payer: Self-pay | Admitting: *Deleted

## 2016-10-30 NOTE — Telephone Encounter (Signed)
Patient requested 10-08-16 labs to be sent to [name], rheumatologist.  Labs results sent.

## 2016-12-03 DIAGNOSIS — R87619 Unspecified abnormal cytological findings in specimens from cervix uteri: Secondary | ICD-10-CM

## 2016-12-03 HISTORY — DX: Unspecified abnormal cytological findings in specimens from cervix uteri: R87.619

## 2016-12-10 ENCOUNTER — Other Ambulatory Visit: Payer: 59

## 2016-12-10 ENCOUNTER — Inpatient Hospital Stay: Payer: 59 | Attending: Hematology and Oncology | Admitting: Hematology and Oncology

## 2016-12-10 ENCOUNTER — Ambulatory Visit: Payer: 59 | Admitting: Hematology and Oncology

## 2016-12-10 ENCOUNTER — Encounter: Payer: Self-pay | Admitting: Hematology and Oncology

## 2016-12-10 ENCOUNTER — Inpatient Hospital Stay: Payer: 59

## 2016-12-10 VITALS — BP 132/74 | HR 60 | Temp 97.6°F | Wt 143.3 lb

## 2016-12-10 DIAGNOSIS — D693 Immune thrombocytopenic purpura: Secondary | ICD-10-CM

## 2016-12-10 DIAGNOSIS — M329 Systemic lupus erythematosus, unspecified: Secondary | ICD-10-CM | POA: Insufficient documentation

## 2016-12-10 DIAGNOSIS — Z79899 Other long term (current) drug therapy: Secondary | ICD-10-CM | POA: Insufficient documentation

## 2016-12-10 LAB — CBC WITH DIFFERENTIAL/PLATELET
Basophils Absolute: 0 10*3/uL (ref 0–0.1)
Basophils Relative: 1 %
Eosinophils Absolute: 0.2 10*3/uL (ref 0–0.7)
Eosinophils Relative: 5 %
HCT: 41.8 % (ref 35.0–47.0)
Hemoglobin: 13.7 g/dL (ref 12.0–16.0)
Lymphocytes Relative: 22 %
Lymphs Abs: 1 10*3/uL (ref 1.0–3.6)
MCH: 29.3 pg (ref 26.0–34.0)
MCHC: 32.8 g/dL (ref 32.0–36.0)
MCV: 89.5 fL (ref 80.0–100.0)
Monocytes Absolute: 0.5 10*3/uL (ref 0.2–0.9)
Monocytes Relative: 12 %
Neutro Abs: 2.7 10*3/uL (ref 1.4–6.5)
Neutrophils Relative %: 60 %
Platelets: 171 10*3/uL (ref 150–440)
RBC: 4.67 MIL/uL (ref 3.80–5.20)
RDW: 13.6 % (ref 11.5–14.5)
WBC: 4.4 10*3/uL (ref 3.6–11.0)

## 2016-12-10 NOTE — Progress Notes (Signed)
Patient offers no complaints today.  Patient had TDAP drawn last week as well as PAP smear.  States everything was good.
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
## 2016-12-28 ENCOUNTER — Encounter: Payer: Self-pay | Admitting: Hematology and Oncology

## 2017-02-04 DIAGNOSIS — Z0100 Encounter for examination of eyes and vision without abnormal findings: Secondary | ICD-10-CM

## 2017-02-04 HISTORY — DX: Encounter for examination of eyes and vision without abnormal findings: Z01.00

## 2017-02-08 DIAGNOSIS — R195 Other fecal abnormalities: Secondary | ICD-10-CM

## 2017-02-08 HISTORY — DX: Other fecal abnormalities: R19.5

## 2017-03-09 ENCOUNTER — Inpatient Hospital Stay: Payer: 59 | Attending: Hematology and Oncology

## 2017-03-09 ENCOUNTER — Telehealth: Payer: Self-pay | Admitting: *Deleted

## 2017-03-09 DIAGNOSIS — Z79899 Other long term (current) drug therapy: Secondary | ICD-10-CM | POA: Insufficient documentation

## 2017-03-09 DIAGNOSIS — M329 Systemic lupus erythematosus, unspecified: Secondary | ICD-10-CM | POA: Diagnosis not present

## 2017-03-09 DIAGNOSIS — D693 Immune thrombocytopenic purpura: Secondary | ICD-10-CM

## 2017-03-09 LAB — CBC WITH DIFFERENTIAL/PLATELET
Basophils Absolute: 0 10*3/uL (ref 0–0.1)
Basophils Relative: 1 %
Eosinophils Absolute: 0.2 10*3/uL (ref 0–0.7)
Eosinophils Relative: 4 %
HCT: 42.2 % (ref 35.0–47.0)
Hemoglobin: 13.8 g/dL (ref 12.0–16.0)
Lymphocytes Relative: 24 %
Lymphs Abs: 1.1 10*3/uL (ref 1.0–3.6)
MCH: 29.4 pg (ref 26.0–34.0)
MCHC: 32.8 g/dL (ref 32.0–36.0)
MCV: 89.7 fL (ref 80.0–100.0)
Monocytes Absolute: 0.4 10*3/uL (ref 0.2–0.9)
Monocytes Relative: 9 %
Neutro Abs: 2.8 10*3/uL (ref 1.4–6.5)
Neutrophils Relative %: 62 %
Platelets: 155 10*3/uL (ref 150–440)
RBC: 4.7 MIL/uL (ref 3.80–5.20)
RDW: 13.8 % (ref 11.5–14.5)
WBC: 4.6 10*3/uL (ref 3.6–11.0)

## 2017-03-09 NOTE — Telephone Encounter (Signed)
Called patient to inform her that her platelet count is good - 155,000.  Patient appreciative of call.

## 2017-03-09 NOTE — Telephone Encounter (Signed)
-----   Message from Lequita Asal, MD sent at 03/09/2017 11:59 AM EDT ----- Regarding: Please call patient  Platelet count good-  155,000.  M  ----- Message ----- From: Interface, Lab In [place] Sent: 03/09/2017   9:14 AM To: Lequita Asal, MD

## 2017-03-11 ENCOUNTER — Other Ambulatory Visit: Payer: 59

## 2017-05-08 IMAGING — CT CT CHEST W/ CM
4 of 5 series · 15 of 36 positions shown, 17 images · IV contrast (omnipaque)
Comparison: No priors.

CLINICAL DATA: 54-year-old female with history of night sweats
since March 2015. Currently on steroid therapy.

EXAM:
CT CHEST, ABDOMEN, AND PELVIS WITH CONTRAST
TECHNIQUE: Multidetector CT imaging of the chest, abdomen and pelvis was
performed following the standard protocol during bolus
administration of intravenous contrast.
CONTRAST:  100mL OMNIPAQUE IOHEXOL 350 MG/ML SOLN

[Series 2: soft tissue · axial · 0.64mm/px · z∈[-785,-545]mm · 4 of 122 slices shown]
[im 9/122  mediastinal]
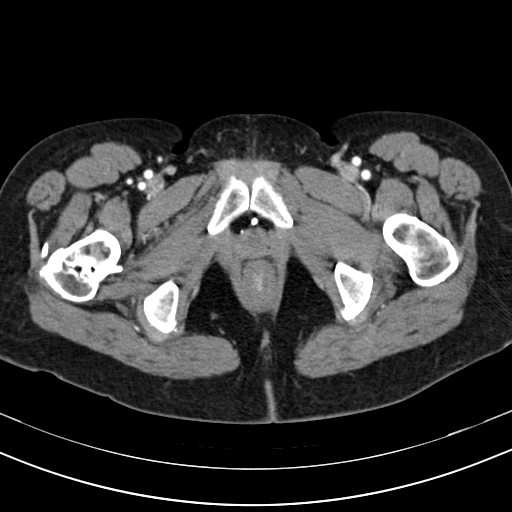
[im 25/122  mediastinal]
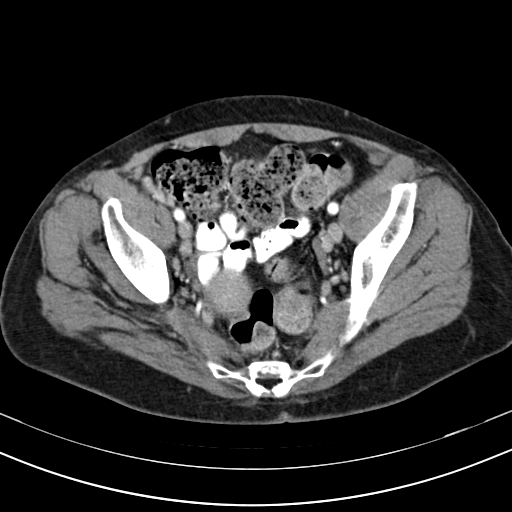
[im 41/122  mediastinal]
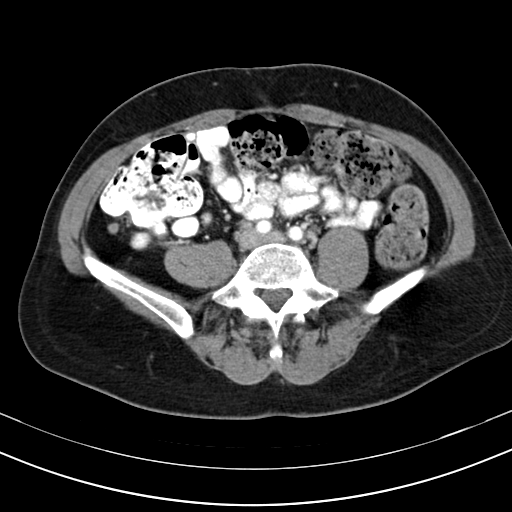
[im 57/122  mediastinal]
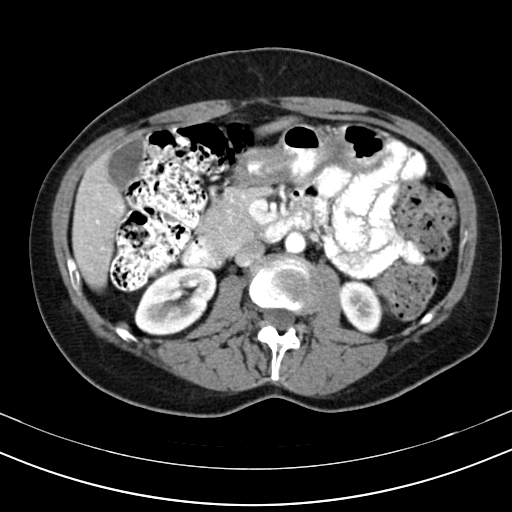

[Series 5: lung · axial · 0.64mm/px · z∈[-470,-265]mm · 6 of 59 slices shown, 8 images]
[im 9/59  mediastinal]
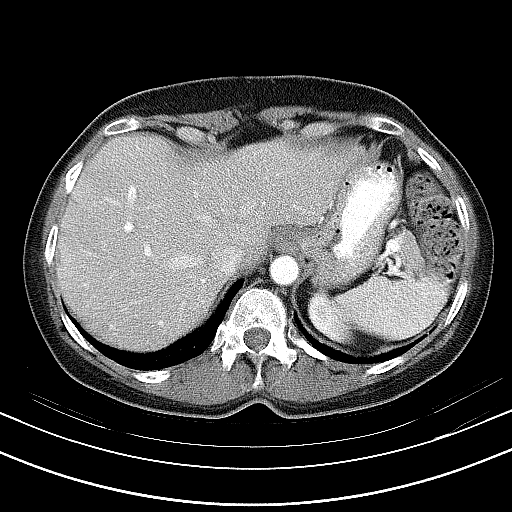
[im 9/59  lung]
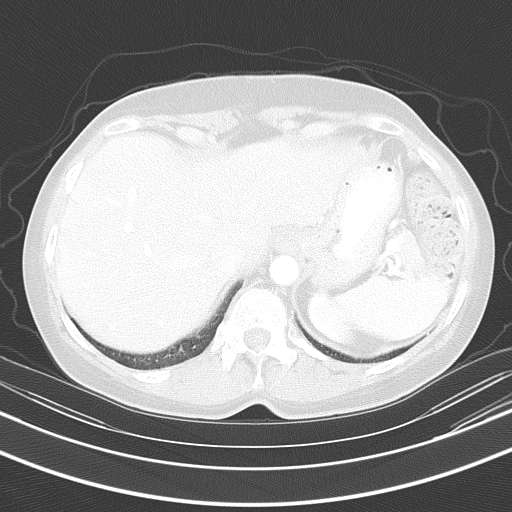
[im 17/59  lung]
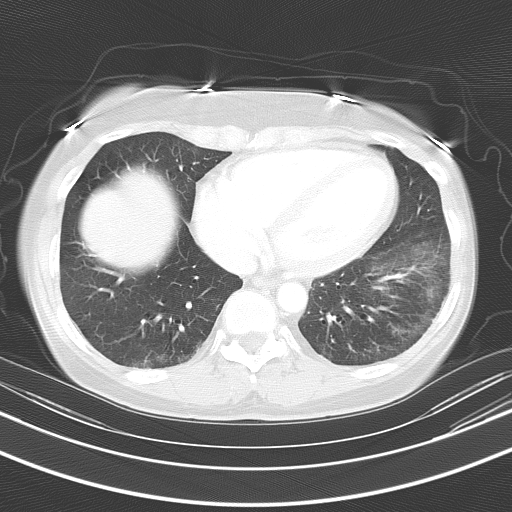
[im 25/59  lung]
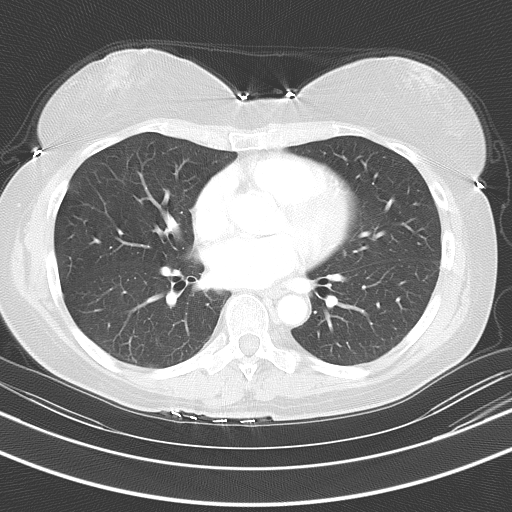
[im 34/59  lung]
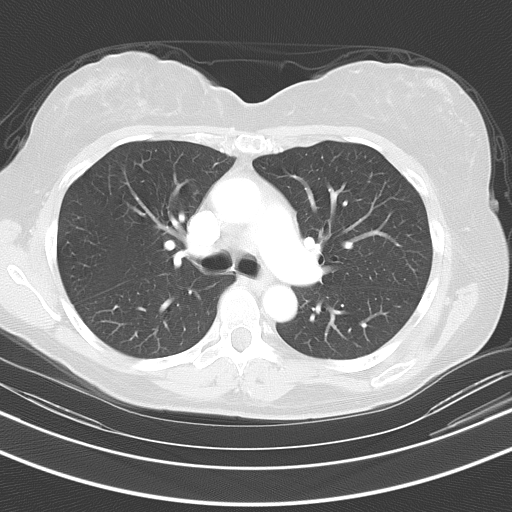
[im 42/59  mediastinal]
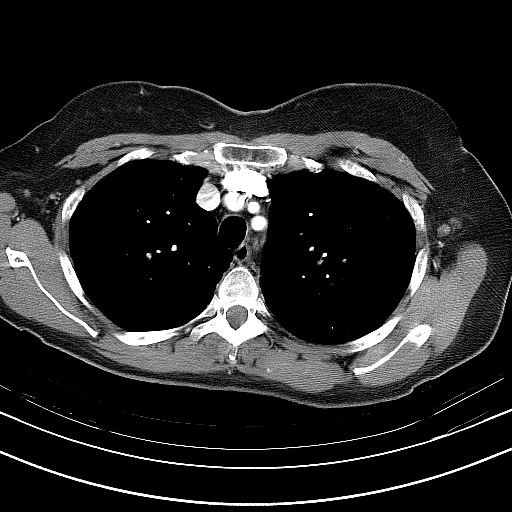
[im 42/59  lung]
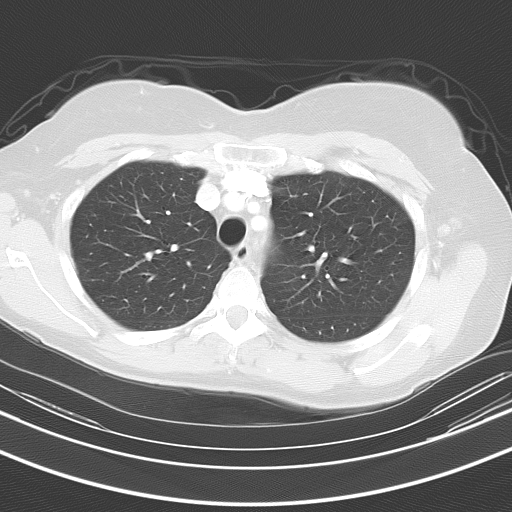
[im 50/59  lung]
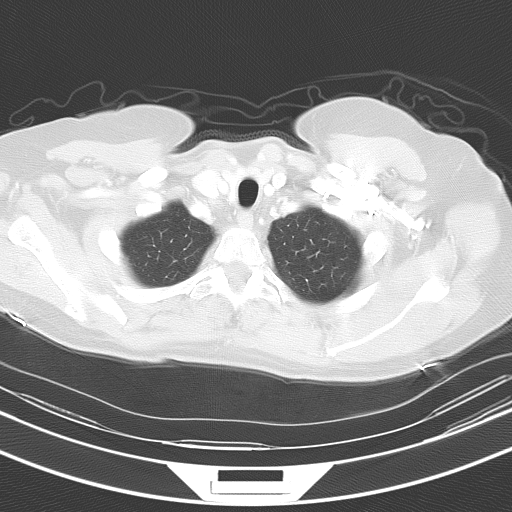

[Series 6: axial delay · axial · delayed · 0.63mm/px · z∈[-551,-501]mm · 2 of 30 slices shown]
[im 10/30  lung]
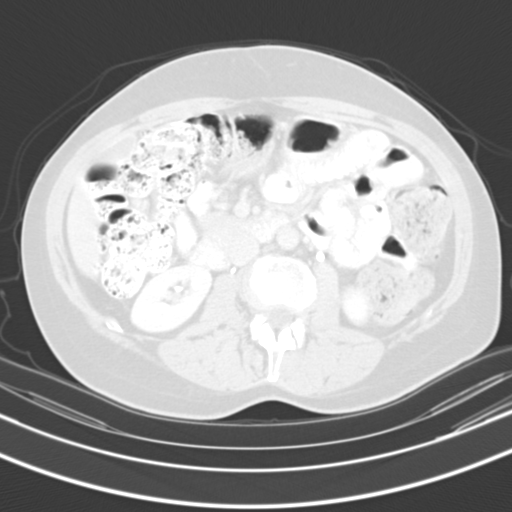
[im 20/30  lung]
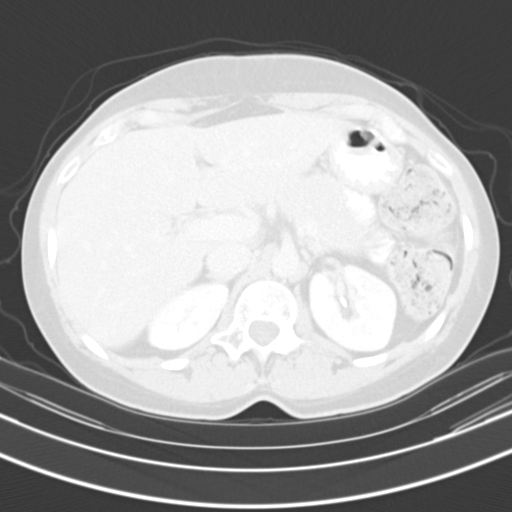

[Series 602: coronals · coronal · 1.18mm/px · 3 of 124 slices shown]
[im 25/124  lung]
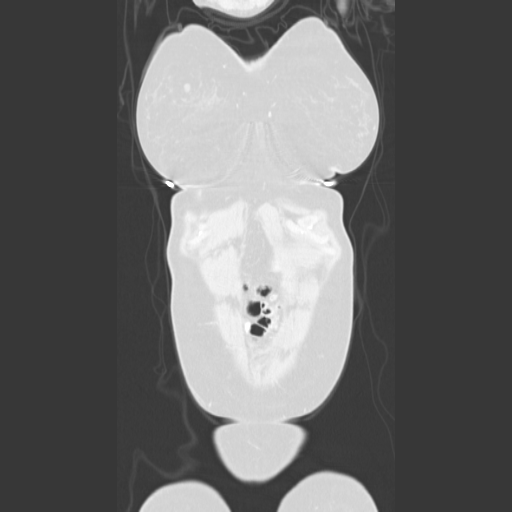
[im 50/124  lung]
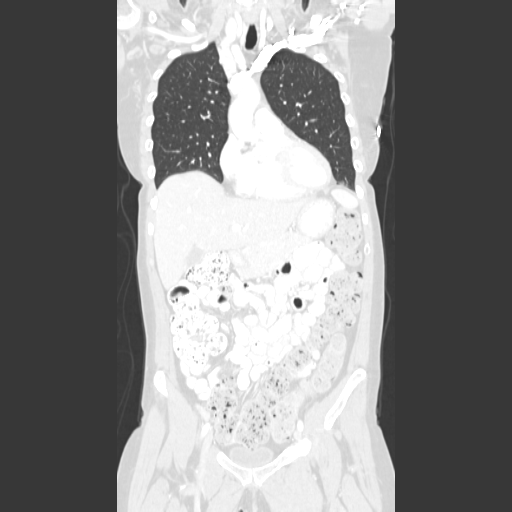
[im 74/124  lung]
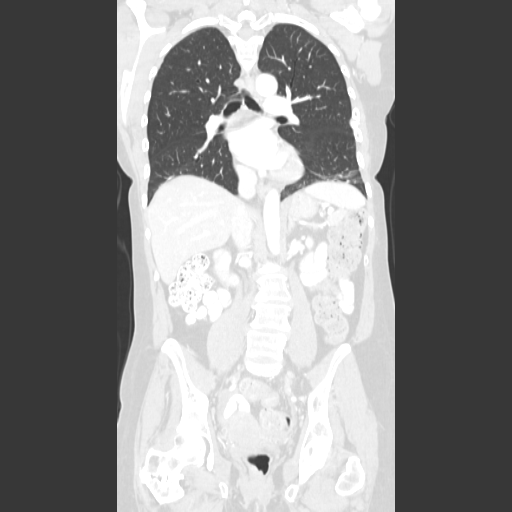

[15 of 36 positions shown; findings below may reference images not displayed]

FINDINGS: CT CHEST FINDINGS

Mediastinum/Lymph Nodes: Heart size is normal. There is no
significant pericardial fluid, thickening or pericardial
calcification. No pathologically enlarged mediastinal or hilar lymph
nodes. Esophagus is unremarkable in appearance. No axillary
lymphadenopathy.

Lungs/Pleura: No suspicious appearing pulmonary nodules or masses.
No acute consolidative airspace disease. No pleural effusions.
Minimal septal thickening and very mild cylindrical bronchiectasis
in the lower lobes of the lungs bilaterally likely reflect some mild
post infectious or inflammatory scarring.

Musculoskeletal/Soft Tissues: Irregularity of the lateral aspect of
the left sixth rib where there is partial bony bridging between the
sixth and seventh ribs, likely related to remote trauma. There are
no aggressive appearing lytic or blastic lesions noted in the
visualized portions of the skeleton. Two lesions in the right
breast, the smaller of which is partially calcified, favored to
represent a fibroadenoma. The larger lesion measures 13 x 14 mm
(image 22 of series 2), and is indeterminate.

CT ABDOMEN AND PELVIS FINDINGS

Hepatobiliary: No suspicious cystic or solid hepatic lesions. No
intra or extrahepatic biliary ductal dilatation. Gallbladder is
normal in appearance.

Pancreas: No pancreatic mass. No pancreatic ductal dilatation. No
pancreatic or peripancreatic fluid or inflammatory changes.

Spleen: Unremarkable.

Adrenals/Urinary Tract: Normal appearance of the kidneys and
bilateral adrenal glands. No hydroureteronephrosis. Urinary bladder
is normal in appearance.

Stomach/Bowel: Normal appearance of the stomach. No pathologic
dilatation of small bowel or colon. Normal appendix.

Vascular/Lymphatic: Minimal atherosclerosis throughout the abdominal
and pelvic vasculature, without evidence of aneurysm or dissection.
No lymphadenopathy noted in the abdomen or pelvis.

Reproductive: Several lesions in the uterus, most likely to
represent fibroids, largest of which is a partially calcified lesion
measuring approximately 4.1 x 3.8 x 3.0 cm in the fundus. There is
also an exophytic lesion (likely a subserosal fibroid) extending
from the left side of the uterine body measuring 3.9 x 3.6 x 2.9 cm.
Ovaries are unremarkable in appearance.

Other: No significant volume of ascites.  No pneumoperitoneum.

Musculoskeletal: There are no aggressive appearing lytic or blastic
lesions noted in the visualized portions of the skeleton. Appearance
of the right proximal femur suggests prior ORIF, with interval
removal of an intramedullary fixation rod.
IMPRESSION: 1. No acute findings in the chest, abdomen or pelvis, and no
explanation for the patient's chronic night sweats.
2. Two indeterminate lesions in the right breast, one of which is
favored to represent a fibroadenoma. Correlation with mammography is
recommended in the near future.
3. Fibroid uterus, as above.
4. Normal appendix.
5. Very mild scarring and cylindrical bronchiectasis in the basal
segments of the lower lobes of the lungs bilaterally, likely sequela
of prior infection.
6. Additional incidental findings, as above.

## 2017-06-17 ENCOUNTER — Inpatient Hospital Stay: Payer: 59

## 2017-06-17 ENCOUNTER — Inpatient Hospital Stay: Payer: 59 | Attending: Hematology and Oncology | Admitting: Hematology and Oncology

## 2017-06-17 ENCOUNTER — Other Ambulatory Visit: Payer: Self-pay | Admitting: *Deleted

## 2017-06-17 ENCOUNTER — Ambulatory Visit: Payer: 59 | Admitting: Hematology and Oncology

## 2017-06-17 ENCOUNTER — Encounter: Payer: Self-pay | Admitting: Hematology and Oncology

## 2017-06-17 ENCOUNTER — Other Ambulatory Visit: Payer: 59

## 2017-06-17 VITALS — BP 130/79 | HR 66 | Temp 97.2°F | Resp 18 | Wt 143.7 lb

## 2017-06-17 DIAGNOSIS — Z79899 Other long term (current) drug therapy: Secondary | ICD-10-CM | POA: Diagnosis not present

## 2017-06-17 DIAGNOSIS — D693 Immune thrombocytopenic purpura: Secondary | ICD-10-CM | POA: Diagnosis not present

## 2017-06-17 DIAGNOSIS — M329 Systemic lupus erythematosus, unspecified: Secondary | ICD-10-CM | POA: Diagnosis not present

## 2017-06-17 LAB — CBC WITH DIFFERENTIAL/PLATELET
Basophils Absolute: 0 10*3/uL (ref 0–0.1)
Basophils Relative: 1 %
Eosinophils Absolute: 0.2 10*3/uL (ref 0–0.7)
Eosinophils Relative: 3 %
HCT: 42.7 % (ref 35.0–47.0)
Hemoglobin: 14 g/dL (ref 12.0–16.0)
Lymphocytes Relative: 18 %
Lymphs Abs: 1 10*3/uL (ref 1.0–3.6)
MCH: 29.2 pg (ref 26.0–34.0)
MCHC: 32.7 g/dL (ref 32.0–36.0)
MCV: 89.4 fL (ref 80.0–100.0)
Monocytes Absolute: 0.5 10*3/uL (ref 0.2–0.9)
Monocytes Relative: 10 %
Neutro Abs: 3.9 10*3/uL (ref 1.4–6.5)
Neutrophils Relative %: 68 %
Platelets: 167 10*3/uL (ref 150–440)
RBC: 4.78 MIL/uL (ref 3.80–5.20)
RDW: 13.8 % (ref 11.5–14.5)
WBC: 5.6 10*3/uL (ref 3.6–11.0)
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
## 2017-06-17 NOTE — Progress Notes (Signed)
Patient offers no complaints today. 

## 2017-09-09 ENCOUNTER — Inpatient Hospital Stay: Payer: 59 | Admitting: Hematology and Oncology

## 2017-09-09 ENCOUNTER — Inpatient Hospital Stay: Payer: 59 | Attending: Hematology and Oncology

## 2017-09-09 ENCOUNTER — Telehealth: Payer: Self-pay | Admitting: *Deleted

## 2017-09-09 ENCOUNTER — Other Ambulatory Visit: Payer: Self-pay | Admitting: *Deleted

## 2017-09-09 ENCOUNTER — Other Ambulatory Visit: Payer: Self-pay | Admitting: Urgent Care

## 2017-09-09 DIAGNOSIS — D693 Immune thrombocytopenic purpura: Secondary | ICD-10-CM

## 2017-09-09 LAB — CBC WITH DIFFERENTIAL/PLATELET
BASOS ABS: 0 10*3/uL (ref 0–0.1)
Basophils Relative: 1 %
Eosinophils Absolute: 0.1 10*3/uL (ref 0–0.7)
Eosinophils Relative: 2 %
HEMATOCRIT: 43.2 % (ref 35.0–47.0)
Hemoglobin: 14.3 g/dL (ref 12.0–16.0)
LYMPHS PCT: 25 %
Lymphs Abs: 1.3 10*3/uL (ref 1.0–3.6)
MCH: 29.7 pg (ref 26.0–34.0)
MCHC: 33.1 g/dL (ref 32.0–36.0)
MCV: 89.6 fL (ref 80.0–100.0)
Monocytes Absolute: 0.5 10*3/uL (ref 0.2–0.9)
Monocytes Relative: 10 %
NEUTROS ABS: 3.2 10*3/uL (ref 1.4–6.5)
Neutrophils Relative %: 62 %
Platelets: 137 10*3/uL — ABNORMAL LOW (ref 150–440)
RBC: 4.82 MIL/uL (ref 3.80–5.20)
RDW: 14.1 % (ref 11.5–14.5)
WBC: 5.1 10*3/uL (ref 3.6–11.0)
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
## 2017-09-09 NOTE — Telephone Encounter (Signed)
Patient on schedule today for Lab/MD.  Per Dr. Mike Gip, patient had labs drawn on 08-26-17.  Patient does not need to be seen today unless she has concerns.  Recommends lab draw only to see what her platelet count is.  Spoke with patient when she came in.  Patient in agreement, stating she does not have to be seen.  Asked her to wait for labs to result.  She agreed.    Printed copy of lab results - per Dr Mike Gip, have patient RTC in 1 month for CBC with diff.  If patient notices and bruising or red spots on her skin in the meantime, she is instructed to call.  Platelets today 137 (Jan. 9 132).  Patient understands and is in agreement.

## 2017-10-14 ENCOUNTER — Inpatient Hospital Stay: Payer: 59 | Attending: Hematology and Oncology

## 2017-10-14 DIAGNOSIS — D693 Immune thrombocytopenic purpura: Secondary | ICD-10-CM | POA: Insufficient documentation

## 2017-10-14 LAB — CBC WITH DIFFERENTIAL/PLATELET
Basophils Absolute: 0 10*3/uL (ref 0–0.1)
Basophils Relative: 1 %
Eosinophils Absolute: 0.1 10*3/uL (ref 0–0.7)
Eosinophils Relative: 3 %
HCT: 42.1 % (ref 35.0–47.0)
Hemoglobin: 14 g/dL (ref 12.0–16.0)
Lymphocytes Relative: 27 %
Lymphs Abs: 1 10*3/uL (ref 1.0–3.6)
MCH: 29.5 pg (ref 26.0–34.0)
MCHC: 33.2 g/dL (ref 32.0–36.0)
MCV: 88.9 fL (ref 80.0–100.0)
Monocytes Absolute: 0.4 10*3/uL (ref 0.2–0.9)
Monocytes Relative: 11 %
Neutro Abs: 2.2 10*3/uL (ref 1.4–6.5)
Neutrophils Relative %: 58 %
Platelets: 136 10*3/uL — ABNORMAL LOW (ref 150–440)
RBC: 4.73 MIL/uL (ref 3.80–5.20)
RDW: 14.1 % (ref 11.5–14.5)
WBC: 3.7 10*3/uL (ref 3.6–11.0)

## 2017-12-23 ENCOUNTER — Inpatient Hospital Stay: Payer: 59 | Attending: Hematology and Oncology

## 2017-12-23 DIAGNOSIS — D693 Immune thrombocytopenic purpura: Secondary | ICD-10-CM | POA: Diagnosis present

## 2017-12-23 LAB — CBC WITH DIFFERENTIAL/PLATELET
Basophils Absolute: 0 10*3/uL (ref 0–0.1)
Basophils Relative: 1 %
Eosinophils Absolute: 0.2 10*3/uL (ref 0–0.7)
Eosinophils Relative: 4 %
HCT: 43 % (ref 35.0–47.0)
Hemoglobin: 14.1 g/dL (ref 12.0–16.0)
Lymphocytes Relative: 22 %
Lymphs Abs: 0.9 10*3/uL — ABNORMAL LOW (ref 1.0–3.6)
MCH: 29.2 pg (ref 26.0–34.0)
MCHC: 32.8 g/dL (ref 32.0–36.0)
MCV: 89 fL (ref 80.0–100.0)
Monocytes Absolute: 0.3 10*3/uL (ref 0.2–0.9)
Monocytes Relative: 8 %
Neutro Abs: 2.8 10*3/uL (ref 1.4–6.5)
Neutrophils Relative %: 65 %
Platelets: 145 10*3/uL — ABNORMAL LOW (ref 150–440)
RBC: 4.83 MIL/uL (ref 3.80–5.20)
RDW: 14 % (ref 11.5–14.5)
WBC: 4.2 10*3/uL (ref 3.6–11.0)

## 2018-04-25 ENCOUNTER — Other Ambulatory Visit: Payer: Self-pay | Admitting: Hematology and Oncology

## 2018-04-26 ENCOUNTER — Other Ambulatory Visit: Payer: Self-pay | Admitting: *Deleted

## 2018-04-26 ENCOUNTER — Other Ambulatory Visit: Payer: Self-pay

## 2018-04-26 DIAGNOSIS — D693 Immune thrombocytopenic purpura: Secondary | ICD-10-CM

## 2018-05-19 ENCOUNTER — Other Ambulatory Visit: Payer: Self-pay | Admitting: Urgent Care

## 2018-06-23 ENCOUNTER — Inpatient Hospital Stay: Payer: 59 | Attending: Hematology and Oncology | Admitting: Hematology and Oncology

## 2018-06-23 ENCOUNTER — Inpatient Hospital Stay: Payer: 59

## 2018-06-23 VITALS — BP 164/82 | HR 65 | Temp 96.3°F | Resp 18 | Wt 144.4 lb

## 2018-06-23 DIAGNOSIS — M329 Systemic lupus erythematosus, unspecified: Secondary | ICD-10-CM | POA: Diagnosis not present

## 2018-06-23 DIAGNOSIS — D693 Immune thrombocytopenic purpura: Secondary | ICD-10-CM

## 2018-06-23 DIAGNOSIS — Z79899 Other long term (current) drug therapy: Secondary | ICD-10-CM | POA: Insufficient documentation

## 2018-06-23 LAB — CBC WITH DIFFERENTIAL/PLATELET
Abs Immature Granulocytes: 0.01 10*3/uL (ref 0.00–0.07)
Basophils Absolute: 0 10*3/uL (ref 0.0–0.1)
Basophils Relative: 0 %
Eosinophils Absolute: 0.2 10*3/uL (ref 0.0–0.5)
Eosinophils Relative: 4 %
HCT: 42.4 % (ref 36.0–46.0)
Hemoglobin: 13.7 g/dL (ref 12.0–15.0)
Immature Granulocytes: 0 %
Lymphocytes Relative: 20 %
Lymphs Abs: 0.9 10*3/uL (ref 0.7–4.0)
MCH: 29.1 pg (ref 26.0–34.0)
MCHC: 32.3 g/dL (ref 30.0–36.0)
MCV: 90 fL (ref 80.0–100.0)
Monocytes Absolute: 0.4 10*3/uL (ref 0.1–1.0)
Monocytes Relative: 8 %
Neutro Abs: 3.1 10*3/uL (ref 1.7–7.7)
Neutrophils Relative %: 68 %
Platelets: 109 10*3/uL — ABNORMAL LOW (ref 150–400)
RBC: 4.71 MIL/uL (ref 3.87–5.11)
RDW: 13.6 % (ref 11.5–15.5)
WBC: 4.6 10*3/uL (ref 4.0–10.5)
nRBC: 0 % (ref 0.0–0.2)

## 2018-06-23 LAB — COMPREHENSIVE METABOLIC PANEL
ALT: 15 U/L (ref 0–44)
AST: 22 U/L (ref 15–41)
Albumin: 4.5 g/dL (ref 3.5–5.0)
Alkaline Phosphatase: 77 U/L (ref 38–126)
Anion gap: 11 (ref 5–15)
BUN: 19 mg/dL (ref 6–20)
CO2: 25 mmol/L (ref 22–32)
Calcium: 8.8 mg/dL — ABNORMAL LOW (ref 8.9–10.3)
Chloride: 104 mmol/L (ref 98–111)
Creatinine, Ser: 0.91 mg/dL (ref 0.44–1.00)
GFR calc Af Amer: >60 mL/min (ref >60)
GFR calc non Af Amer: >60 mL/min (ref >60)
Glucose, Bld: 114 mg/dL — ABNORMAL HIGH (ref 70–99)
Potassium: 4.2 mmol/L (ref 3.5–5.1)
Sodium: 140 mmol/L (ref 135–145)
Total Bilirubin: 0.9 mg/dL (ref 0.3–1.2)
Total Protein: 7.6 g/dL (ref 6.5–8.1)

## 2018-06-23 LAB — PROTEIN / CREATININE RATIO, URINE
Creatinine, Urine: 112 mg/dL
Protein Creatinine Ratio: 0.08 mg/mg{Cre} (ref 0.00–0.15)
Total Protein, Urine: 9 mg/dL

## 2018-06-23 NOTE — Progress Notes (Signed)
Patient offers no complaints today. 

## 2018-06-23 NOTE — Patient Instructions (Signed)
Thrombocytopenia           Thrombocytopenia is a condition in which you have an abnormally decreased number of platelets in your blood. Platelets are also called thrombocytes. Platelets are needed for blood clotting. Some cases of thrombocytopenia are mild while others are more severe. What are the causes? This condition may be caused by:  Decreased production of platelets. This can be caused by: ? Aplastic anemia, in which your bone marrow quits making blood cells. ? Cancer in the bone marrow. ? Use of certain medicines, including chemotherapy. ? Infection in the bone marrow. ? Heavy alcohol consumption.  Increased destruction of platelets. This can be caused by: ? Certain immune diseases. ? Use of certain drugs. ? Certain blood clotting disorders. ? Certain inherited disorders. ? Certain bleeding disorders. ? Pregnancy.  Having an enlarged spleen (hypersplenism). In hypersplenism, the spleen gathers up platelets from circulation. This means that the platelets are not available to help with blood clotting. The spleen can be enlarged because of cirrhosis or other conditions.  What are the signs or symptoms? Symptoms of this condition are side effects of poor blood clotting. They will vary depending on how low the platelet counts are. Symptoms may include:  Abnormal bleeding.  Nosebleeds.  Heavy menstrual periods.  Blood in the urine or stool (feces).  A purplish discoloration in the skin (purpura).  Bruising.  A rash that looks like pinpoint, purplish-red spots (petechiae) on the skin and mucous membranes.  How is this diagnosed? This condition may be diagnosed with blood tests and a physical exam. Sometimes, a sample of bone marrow may be removed to look for the original cells (megakaryocytes) that make platelets. How is this treated? Treatment for this condition depends on the cause. Treatment options may include:  Treatment of another condition that is causing  the low platelet count.  Medicines to help protect your platelets from being destroyed.  A replacement (transfusion) of platelets to stop or prevent bleeding.  Surgery to remove the spleen.  Follow these instructions at home: General instructions  Check your skin and the linings inside your mouth for bruising or bleeding as told by your health care provider.  Check your sputum, urine, and stool for blood as told by your health care provider.  Ask your health care provider if it is okay for you to drink alcohol.  Take over-the-counter and prescription medicines only as told by your health care provider.  Tell all of your health care providers, including dentists and eye doctors, about your condition. Activity  Until your health care provider says it is okay. ? Do not return to any activities that could cause bumps or bruises.  Take extra care not to cut yourself when you shave or when you use scissors, needles, knives, and other tools.  Take extra care not to burn yourself when ironing or cooking. Contact a health care provider if:  You have unexplained bruising. Get help right away if:  You have active bleeding from anywhere on your body.  You have blood in your sputum, urine, or stool. This information is not intended to replace advice given to you by your health care provider. Make sure you discuss any questions you have with your health care provider. Document Released: 08/04/2005 Document Revised: 04/06/2016 Document Reviewed: 02/05/2015 [employer] Interactive Patient Education  2018 [employer]  
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
## 2018-06-24 LAB — ANTI-DNA ANTIBODY, DOUBLE-STRANDED: ds DNA Ab: 5 IU/mL (ref 0–9)

## 2018-06-24 LAB — C3 COMPLEMENT: C3 Complement: 85 mg/dL (ref 82–167)

## 2018-06-24 LAB — C4 COMPLEMENT: Complement C4, Body Fluid: 26 mg/dL (ref 14–44)

## 2018-07-21 ENCOUNTER — Other Ambulatory Visit: Payer: 59

## 2018-07-28 ENCOUNTER — Inpatient Hospital Stay: Payer: 59 | Attending: Hematology and Oncology

## 2018-07-28 DIAGNOSIS — D693 Immune thrombocytopenic purpura: Secondary | ICD-10-CM

## 2018-07-28 LAB — CBC WITH DIFFERENTIAL/PLATELET
Abs Immature Granulocytes: 0.01 10*3/uL (ref 0.00–0.07)
Basophils Absolute: 0 10*3/uL (ref 0.0–0.1)
Basophils Relative: 0 %
Eosinophils Absolute: 0.1 10*3/uL (ref 0.0–0.5)
Eosinophils Relative: 2 %
HCT: 42.6 % (ref 36.0–46.0)
Hemoglobin: 13.7 g/dL (ref 12.0–15.0)
Immature Granulocytes: 0 %
Lymphocytes Relative: 21 %
Lymphs Abs: 1 10*3/uL (ref 0.7–4.0)
MCH: 29 pg (ref 26.0–34.0)
MCHC: 32.2 g/dL (ref 30.0–36.0)
MCV: 90.3 fL (ref 80.0–100.0)
Monocytes Absolute: 0.5 10*3/uL (ref 0.1–1.0)
Monocytes Relative: 11 %
Neutro Abs: 3.1 10*3/uL (ref 1.7–7.7)
Neutrophils Relative %: 66 %
Platelets: 117 10*3/uL — ABNORMAL LOW (ref 150–400)
RBC: 4.72 MIL/uL (ref 3.87–5.11)
RDW: 13.7 % (ref 11.5–15.5)
WBC: 4.7 10*3/uL (ref 4.0–10.5)
nRBC: 0 % (ref 0.0–0.2)

## 2018-08-16 ENCOUNTER — Encounter: Payer: Self-pay | Admitting: Hematology and Oncology

## 2018-10-02 ENCOUNTER — Other Ambulatory Visit: Payer: Self-pay | Admitting: Nurse Practitioner

## 2018-12-22 ENCOUNTER — Other Ambulatory Visit: Payer: 59

## 2018-12-22 ENCOUNTER — Ambulatory Visit: Payer: 59 | Admitting: Hematology and Oncology
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
## 2019-01-26 ENCOUNTER — Ambulatory Visit (HOSPITAL_BASED_OUTPATIENT_CLINIC_OR_DEPARTMENT_OTHER): Payer: 59 | Admitting: Hematology and Oncology

## 2019-01-26 ENCOUNTER — Inpatient Hospital Stay: Payer: 59 | Attending: Hematology and Oncology

## 2019-01-26 ENCOUNTER — Other Ambulatory Visit: Payer: Self-pay

## 2019-01-26 ENCOUNTER — Encounter: Payer: Self-pay | Admitting: Hematology and Oncology

## 2019-01-26 DIAGNOSIS — Z8249 Family history of ischemic heart disease and other diseases of the circulatory system: Secondary | ICD-10-CM | POA: Diagnosis not present

## 2019-01-26 DIAGNOSIS — Z833 Family history of diabetes mellitus: Secondary | ICD-10-CM | POA: Insufficient documentation

## 2019-01-26 DIAGNOSIS — D693 Immune thrombocytopenic purpura: Secondary | ICD-10-CM | POA: Diagnosis not present

## 2019-01-26 DIAGNOSIS — M329 Systemic lupus erythematosus, unspecified: Secondary | ICD-10-CM | POA: Diagnosis present

## 2019-01-26 DIAGNOSIS — Z8042 Family history of malignant neoplasm of prostate: Secondary | ICD-10-CM | POA: Diagnosis not present

## 2019-01-26 LAB — CBC WITH DIFFERENTIAL/PLATELET
Abs Immature Granulocytes: 0.01 10*3/uL (ref 0.00–0.07)
Basophils Absolute: 0 10*3/uL (ref 0.0–0.1)
Basophils Relative: 0 %
Eosinophils Absolute: 0.1 10*3/uL (ref 0.0–0.5)
Eosinophils Relative: 1 %
HCT: 42.6 % (ref 36.0–46.0)
Hemoglobin: 13.7 g/dL (ref 12.0–15.0)
Immature Granulocytes: 0 %
Lymphocytes Relative: 23 %
Lymphs Abs: 1.1 10*3/uL (ref 0.7–4.0)
MCH: 29.6 pg (ref 26.0–34.0)
MCHC: 32.2 g/dL (ref 30.0–36.0)
MCV: 92 fL (ref 80.0–100.0)
Monocytes Absolute: 0.6 10*3/uL (ref 0.1–1.0)
Monocytes Relative: 11 %
Neutro Abs: 3.2 10*3/uL (ref 1.7–7.7)
Neutrophils Relative %: 65 %
Platelets: 124 10*3/uL — ABNORMAL LOW (ref 150–400)
RBC: 4.63 MIL/uL (ref 3.87–5.11)
RDW: 13.8 % (ref 11.5–15.5)
WBC: 5 10*3/uL (ref 4.0–10.5)
nRBC: 0 % (ref 0.0–0.2)

## 2019-01-26 NOTE — Progress Notes (Signed)
Confirmed Name, DOB, and Address. Denies any concerns.  

## 2019-07-27 ENCOUNTER — Other Ambulatory Visit: Payer: 59

## 2019-07-27 ENCOUNTER — Ambulatory Visit: Payer: 59 | Admitting: Hematology and Oncology
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
## 2019-08-30 NOTE — Progress Notes (Signed)
Confirmed Name and DOB. Denies any concerns.  

## 2019-08-31 ENCOUNTER — Encounter: Payer: Self-pay | Admitting: Hematology and Oncology

## 2019-08-31 ENCOUNTER — Inpatient Hospital Stay (HOSPITAL_BASED_OUTPATIENT_CLINIC_OR_DEPARTMENT_OTHER): Payer: 59 | Admitting: Hematology and Oncology

## 2019-08-31 ENCOUNTER — Other Ambulatory Visit: Payer: Self-pay

## 2019-08-31 ENCOUNTER — Inpatient Hospital Stay: Payer: 59 | Attending: Hematology and Oncology

## 2019-08-31 VITALS — BP 164/83 | HR 60 | Temp 97.0°F | Resp 18 | Ht 67.5 in | Wt 143.2 lb

## 2019-08-31 DIAGNOSIS — D693 Immune thrombocytopenic purpura: Secondary | ICD-10-CM | POA: Diagnosis present

## 2019-08-31 DIAGNOSIS — Z79899 Other long term (current) drug therapy: Secondary | ICD-10-CM | POA: Diagnosis not present

## 2019-08-31 DIAGNOSIS — M329 Systemic lupus erythematosus, unspecified: Secondary | ICD-10-CM | POA: Diagnosis not present

## 2019-08-31 DIAGNOSIS — R030 Elevated blood-pressure reading, without diagnosis of hypertension: Secondary | ICD-10-CM | POA: Insufficient documentation

## 2019-08-31 DIAGNOSIS — Z8249 Family history of ischemic heart disease and other diseases of the circulatory system: Secondary | ICD-10-CM | POA: Insufficient documentation

## 2019-08-31 DIAGNOSIS — Z833 Family history of diabetes mellitus: Secondary | ICD-10-CM | POA: Insufficient documentation

## 2019-08-31 LAB — CBC WITH DIFFERENTIAL/PLATELET
Abs Immature Granulocytes: 0.01 10*3/uL (ref 0.00–0.07)
Basophils Absolute: 0 10*3/uL (ref 0.0–0.1)
Basophils Relative: 0 %
Eosinophils Absolute: 0.1 10*3/uL (ref 0.0–0.5)
Eosinophils Relative: 1 %
HCT: 44.1 % (ref 36.0–46.0)
Hemoglobin: 14.4 g/dL (ref 12.0–15.0)
Immature Granulocytes: 0 %
Lymphocytes Relative: 22 %
Lymphs Abs: 1 10*3/uL (ref 0.7–4.0)
MCH: 29.3 pg (ref 26.0–34.0)
MCHC: 32.7 g/dL (ref 30.0–36.0)
MCV: 89.8 fL (ref 80.0–100.0)
Monocytes Absolute: 0.5 10*3/uL (ref 0.1–1.0)
Monocytes Relative: 12 %
Neutro Abs: 3 10*3/uL (ref 1.7–7.7)
Neutrophils Relative %: 65 %
Platelets: 125 10*3/uL — ABNORMAL LOW (ref 150–400)
RBC: 4.91 MIL/uL (ref 3.87–5.11)
RDW: 13.7 % (ref 11.5–15.5)
WBC: 4.6 10*3/uL (ref 4.0–10.5)
nRBC: 0 % (ref 0.0–0.2)

## 2019-09-01 ENCOUNTER — Telehealth: Payer: Self-pay

## 2019-09-01 LAB — C4 COMPLEMENT: Complement C4, Body Fluid: 21 mg/dL (ref 12–38)

## 2019-09-01 LAB — C3 COMPLEMENT: C3 Complement: 90 mg/dL (ref 82–167)

## 2019-09-01 NOTE — Telephone Encounter (Signed)
Attempted to contact patient to inform her of Complement levels. VM left requesting callback.

## 2019-09-01 NOTE — Telephone Encounter (Signed)
-----   Message from Lequita Asal, MD sent at 09/01/2019  6:55 AM EST ----- Regarding: Please call patient with complement levels  ----- Message ----- From: Interface, Lab In [employer] Sent: 08/31/2019   9:20 AM EST To: Lequita Asal, MD

## 2019-12-16 ENCOUNTER — Telehealth: Payer: Self-pay | Admitting: *Deleted

## 2019-12-16 ENCOUNTER — Other Ambulatory Visit: Payer: Self-pay | Admitting: Hematology and Oncology

## 2019-12-16 NOTE — Telephone Encounter (Signed)
I called ptt and ask her to have her labs faxed to [place]

## 2019-12-16 NOTE — Telephone Encounter (Signed)
Patient called reporting that her platelet count is falling again and that she was told to notify Dr Mike Gip if something changes. Please advise

## 2019-12-16 NOTE — Telephone Encounter (Signed)
  I don't see any labs on her.  M
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
## 2019-12-19 ENCOUNTER — Inpatient Hospital Stay: Payer: 59

## 2019-12-19 ENCOUNTER — Other Ambulatory Visit: Payer: Self-pay

## 2019-12-19 ENCOUNTER — Inpatient Hospital Stay: Payer: 59 | Attending: Hematology and Oncology | Admitting: Hematology and Oncology

## 2019-12-19 ENCOUNTER — Encounter: Payer: Self-pay | Admitting: Hematology and Oncology

## 2019-12-19 VITALS — BP 145/68 | HR 68 | Temp 97.9°F | Resp 16 | Wt 143.4 lb

## 2019-12-19 DIAGNOSIS — M329 Systemic lupus erythematosus, unspecified: Secondary | ICD-10-CM | POA: Insufficient documentation

## 2019-12-19 DIAGNOSIS — R791 Abnormal coagulation profile: Secondary | ICD-10-CM | POA: Diagnosis not present

## 2019-12-19 DIAGNOSIS — Z833 Family history of diabetes mellitus: Secondary | ICD-10-CM | POA: Diagnosis not present

## 2019-12-19 DIAGNOSIS — Z8042 Family history of malignant neoplasm of prostate: Secondary | ICD-10-CM | POA: Diagnosis not present

## 2019-12-19 DIAGNOSIS — D693 Immune thrombocytopenic purpura: Secondary | ICD-10-CM | POA: Insufficient documentation

## 2019-12-19 DIAGNOSIS — Z8249 Family history of ischemic heart disease and other diseases of the circulatory system: Secondary | ICD-10-CM | POA: Diagnosis not present

## 2019-12-19 LAB — COMPREHENSIVE METABOLIC PANEL
ALT: 20 U/L (ref 0–44)
AST: 19 U/L (ref 15–41)
Albumin: 4.8 g/dL (ref 3.5–5.0)
Alkaline Phosphatase: 74 U/L (ref 38–126)
Anion gap: 8 (ref 5–15)
BUN: 25 mg/dL — ABNORMAL HIGH (ref 8–23)
CO2: 25 mmol/L (ref 22–32)
Calcium: 9.3 mg/dL (ref 8.9–10.3)
Chloride: 103 mmol/L (ref 98–111)
Creatinine, Ser: 0.85 mg/dL (ref 0.44–1.00)
GFR calc Af Amer: >60 mL/min (ref >60)
GFR calc non Af Amer: >60 mL/min (ref >60)
Glucose, Bld: 117 mg/dL — ABNORMAL HIGH (ref 70–99)
Potassium: 4 mmol/L (ref 3.5–5.1)
Sodium: 136 mmol/L (ref 135–145)
Total Bilirubin: 0.9 mg/dL (ref 0.3–1.2)
Total Protein: 7.9 g/dL (ref 6.5–8.1)

## 2019-12-19 LAB — CBC WITH DIFFERENTIAL/PLATELET
Abs Immature Granulocytes: 0.02 10*3/uL (ref 0.00–0.07)
Basophils Absolute: 0 10*3/uL (ref 0.0–0.1)
Basophils Relative: 0 %
Eosinophils Absolute: 0 10*3/uL (ref 0.0–0.5)
Eosinophils Relative: 0 %
HCT: 45 % (ref 36.0–46.0)
Hemoglobin: 14.6 g/dL (ref 12.0–15.0)
Immature Granulocytes: 0 %
Lymphocytes Relative: 9 %
Lymphs Abs: 0.8 10*3/uL (ref 0.7–4.0)
MCH: 29.2 pg (ref 26.0–34.0)
MCHC: 32.4 g/dL (ref 30.0–36.0)
MCV: 90 fL (ref 80.0–100.0)
Monocytes Absolute: 0.2 10*3/uL (ref 0.1–1.0)
Monocytes Relative: 2 %
Neutro Abs: 8.2 10*3/uL — ABNORMAL HIGH (ref 1.7–7.7)
Neutrophils Relative %: 89 %
Platelets: 101 10*3/uL — ABNORMAL LOW (ref 150–400)
RBC: 5 MIL/uL (ref 3.87–5.11)
RDW: 13.9 % (ref 11.5–15.5)
WBC: 9.2 10*3/uL (ref 4.0–10.5)
nRBC: 0 % (ref 0.0–0.2)

## 2019-12-19 LAB — PROTIME-INR
INR: 0.9 (ref 0.8–1.2)
Prothrombin Time: 12 seconds (ref 11.4–15.2)

## 2019-12-19 LAB — APTT: aPTT: 122 seconds — ABNORMAL HIGH (ref 24–36)

## 2019-12-19 LAB — TSH: TSH: 1.599 u[IU]/mL (ref 0.350–4.500)

## 2019-12-19 LAB — T4, FREE: Free T4: 0.96 ng/dL (ref 0.61–1.12)

## 2019-12-19 NOTE — Patient Instructions (Signed)
Omeprazole tablets (OTC) What is this medicine? OMEPRAZOLE (oh ME pray zol) prevents the production of acid in the stomach. It is used to treat the symptoms of heartburn. You can buy this medicine without a prescription. This product is not for long-term use, unless otherwise directed by your doctor or health care professional. This medicine may be used for other purposes; ask your health care provider or pharmacist if you have questions. COMMON BRAND NAME(S): Prilosec OTC What should I tell my health care provider before I take this medicine? They need to know if you have any of these conditions:  black or bloody stools  chest pain  difficulty swallowing  have had heartburn for over 3 months  have heartburn with dizziness, lightheadedness or sweating  liver disease  lupus  stomach pain  unexplained weight loss  vomiting with blood  wheezing  an unusual or allergic reaction to omeprazole, other medicines, foods, dyes, or preservatives  pregnant or trying to get pregnant  breast-feeding How should I use this medicine? Take this medicine by mouth with a glass of water. Follow the directions on the product label. Do not cut, crush or chew this medicine. Swallow the tablets whole. Take this medicine on an empty stomach, at least 30 minutes before breakfast. Take your medicine at regular intervals. Do not take it more often than directed. Talk to your pediatrician regarding the use of this medicine in children. Special care may be needed. Overdosage: If you think you have taken too much of this medicine contact a poison control center or emergency room at once. NOTE: This medicine is only for you. Do not share this medicine with others. What if I miss a dose? If you miss a dose, take it as soon as you can. If it is almost time for your next dose, take only that dose. Do not take double or extra doses. What may interact with this medicine? Do not take this medicine with any of  the following medications:  atazanavir  clopidogrel  nelfinavir  rilpivirine This medicine may also interact with the following medications:  antifungals like itraconazole, ketoconazole, and voriconazole  certain antivirals for HIV or hepatitis  certain medicines that treat or prevent blood clots like warfarin  cilostazol  citalopram  cyclosporine  dasatinib  digoxin  disulfiram  diuretics  erlotinib  iron supplements  medicines for anxiety, panic, and sleep like diazepam  medicines for seizures like carbamazepine, phenobarbital, phenytoin  methotrexate  mycophenolate mofetil  nilotinib  rifampin  St. John's wort  tacrolimus  vitamin B12 This list may not describe all possible interactions. Give your health care provider a list of all the medicines, herbs, non-prescription drugs, or dietary supplements you use. Also tell them if you smoke, drink alcohol, or use illegal drugs. Some items may interact with your medicine. What should I watch for while using this medicine? It can take several days before your heartburn gets better. Tell your healthcare professional if your symptoms do not start to get better or if they get worse. If you need to take this medicine for more than 14 days, talk to your healthcare professional. Heartburn may sometimes be caused by a more serious condition. This medicine may cause a decrease in vitamin B12. You should make sure that you get enough vitamin B12 while you are taking this medicine. Discuss the foods you eat and the vitamins you take with your health care professional. What side effects may I notice from receiving this medicine? Side effects that  you should report to your doctor or health care professional as soon as possible:  allergic reactions like skin rash, itching or hives, swelling of the face, lips, or tongue  bone pain  breathing problems  fever or sore throat  joint pain  rash on cheeks or arms that  gets worse in the sun  redness, blistering, peeling, or loosening of the skin, including inside the mouth  severe diarrhea  signs and symptoms of kidney injury like trouble passing urine or change in the amount of urine  signs and symptoms of low magnesium like muscle cramps; muscle pain; muscle weakness; tremors; seizures; or fast, irregular heartbeat  stomach polyps  unusual bleeding or bruising Side effects that usually do not require medical attention (report to your doctor or health care professional if they continue or are bothersome):  diarrhea  dry mouth  gas  headache  nausea  stomach pain This list may not describe all possible side effects. Call your doctor for medical advice about side effects. You may report side effects to FDA at 1-800-FDA-1088. Where should I keep my medicine? Keep out of the reach of children. Store at room temperature between 20 and 25 degrees C (68 and 77 degrees F). Protect from light and moisture. Throw away any unused medicine after the expiration date. NOTE: This sheet is a summary. It may not cover all possible information. If you have questions about this medicine, talk to your doctor, pharmacist, or health care provider.  2020 Elsevier/Gold Standard (2018-05-26 13:06:30)  

## 2019-12-20 LAB — HIV ANTIBODY (ROUTINE TESTING W REFLEX): HIV Screen 4th Generation wRfx: NONREACTIVE

## 2019-12-20 LAB — HEPATITIS B CORE ANTIBODY, TOTAL: Hep B Core Total Ab: NONREACTIVE

## 2019-12-20 LAB — HEPATITIS C ANTIBODY: HCV Ab: NONREACTIVE

## 2019-12-20 LAB — HEPATITIS B SURFACE ANTIGEN: Hepatitis B Surface Ag: NONREACTIVE

## 2019-12-23 ENCOUNTER — Other Ambulatory Visit: Payer: Self-pay

## 2019-12-23 ENCOUNTER — Ambulatory Visit: Payer: 59 | Admitting: Hematology and Oncology

## 2019-12-23 ENCOUNTER — Inpatient Hospital Stay: Payer: 59

## 2019-12-23 ENCOUNTER — Other Ambulatory Visit: Payer: Self-pay | Admitting: Hematology and Oncology

## 2019-12-23 ENCOUNTER — Encounter: Payer: Self-pay | Admitting: Hematology and Oncology

## 2019-12-23 ENCOUNTER — Ambulatory Visit (HOSPITAL_BASED_OUTPATIENT_CLINIC_OR_DEPARTMENT_OTHER): Payer: 59 | Admitting: Hematology and Oncology

## 2019-12-23 VITALS — BP 171/75 | HR 66 | Temp 96.4°F | Wt 144.6 lb

## 2019-12-23 DIAGNOSIS — R791 Abnormal coagulation profile: Secondary | ICD-10-CM | POA: Diagnosis not present

## 2019-12-23 DIAGNOSIS — D693 Immune thrombocytopenic purpura: Secondary | ICD-10-CM | POA: Diagnosis not present

## 2019-12-23 LAB — CBC
HCT: 44.1 % (ref 36.0–46.0)
Hemoglobin: 14.3 g/dL (ref 12.0–15.0)
MCH: 29.7 pg (ref 26.0–34.0)
MCHC: 32.4 g/dL (ref 30.0–36.0)
MCV: 91.7 fL (ref 80.0–100.0)
Platelets: 146 10*3/uL — ABNORMAL LOW (ref 150–400)
RBC: 4.81 MIL/uL (ref 3.87–5.11)
RDW: 14.1 % (ref 11.5–15.5)
WBC: 9 10*3/uL (ref 4.0–10.5)
nRBC: 0 % (ref 0.0–0.2)

## 2019-12-23 NOTE — Progress Notes (Signed)
No new changes noted today 
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
## 2019-12-25 DIAGNOSIS — R791 Abnormal coagulation profile: Secondary | ICD-10-CM | POA: Insufficient documentation

## 2019-12-26 ENCOUNTER — Inpatient Hospital Stay: Payer: 59

## 2019-12-27 LAB — FACTOR 8 ASSAY: Coagulation Factor VIII: 256 % — ABNORMAL HIGH (ref 56–140)

## 2019-12-27 LAB — FACTOR 9 ASSAY: Coagulation Factor IX: 95 % (ref 60–177)

## 2019-12-27 LAB — FACTOR 11 ASSAY: Factor XI Activity: 103 % (ref 60–150)

## 2019-12-27 LAB — FACTOR 12 ASSAY: Factor XII Activity: <1 % — ABNORMAL LOW (ref 50–150)

## 2019-12-28 ENCOUNTER — Telehealth: Payer: Self-pay

## 2019-12-28 ENCOUNTER — Inpatient Hospital Stay: Payer: 59

## 2019-12-28 ENCOUNTER — Other Ambulatory Visit: Payer: Self-pay

## 2019-12-28 DIAGNOSIS — R791 Abnormal coagulation profile: Secondary | ICD-10-CM

## 2019-12-28 DIAGNOSIS — D693 Immune thrombocytopenic purpura: Secondary | ICD-10-CM

## 2019-12-28 LAB — CBC WITH DIFFERENTIAL/PLATELET
Abs Immature Granulocytes: 0.04 10*3/uL (ref 0.00–0.07)
Basophils Absolute: 0 10*3/uL (ref 0.0–0.1)
Basophils Relative: 0 %
Eosinophils Absolute: 0 10*3/uL (ref 0.0–0.5)
Eosinophils Relative: 0 %
HCT: 44.4 % (ref 36.0–46.0)
Hemoglobin: 14.1 g/dL (ref 12.0–15.0)
Immature Granulocytes: 0 %
Lymphocytes Relative: 25 %
Lymphs Abs: 2.6 10*3/uL (ref 0.7–4.0)
MCH: 29.9 pg (ref 26.0–34.0)
MCHC: 31.8 g/dL (ref 30.0–36.0)
MCV: 94.1 fL (ref 80.0–100.0)
Monocytes Absolute: 0.6 10*3/uL (ref 0.1–1.0)
Monocytes Relative: 6 %
Neutro Abs: 6.8 10*3/uL (ref 1.7–7.7)
Neutrophils Relative %: 69 %
Platelets: 167 10*3/uL (ref 150–400)
RBC: 4.72 MIL/uL (ref 3.87–5.11)
RDW: 14.5 % (ref 11.5–15.5)
WBC: 10.1 10*3/uL (ref 4.0–10.5)
nRBC: 0 % (ref 0.0–0.2)

## 2019-12-28 LAB — LUPUS ANTICOAGULANT PANEL
DRVVT: 29.5 s (ref 0.0–47.0)
PTT Lupus Anticoagulant: >160 s — ABNORMAL HIGH (ref 0.0–51.9)

## 2019-12-28 LAB — PTT-LA INCUB MIX: PTT-LA Incub Mix: 36.7 s (ref 0.0–48.9)

## 2019-12-28 LAB — PTT FACTOR INHIBITOR (MIXING STUDY)
aPTT 1:1 NP Incub. Mix Ctl: 25.6 s (ref 22.9–30.2)
aPTT 1:1 NP Mix, 60 Min,Incub.: 25.6 s (ref 22.9–30.2)
aPTT 1:1 Normal Plasma: 27.6 s (ref 22.9–30.2)
aPTT: >121.9 s — ABNORMAL HIGH (ref 22.9–30.2)

## 2019-12-28 LAB — PTT-LA MIX: PTT-LA Mix: 34.4 s (ref 0.0–48.9)

## 2019-12-28 NOTE — Telephone Encounter (Signed)
Patient aware that she is to decrease prednisone to 20mg  per day on Friday 12/30/19 per Dr Mike Gip.

## 2020-01-02 ENCOUNTER — Other Ambulatory Visit: Payer: 59

## 2020-01-04 ENCOUNTER — Other Ambulatory Visit: Payer: Self-pay

## 2020-01-04 ENCOUNTER — Telehealth: Payer: Self-pay

## 2020-01-04 ENCOUNTER — Inpatient Hospital Stay: Payer: 59

## 2020-01-04 DIAGNOSIS — D693 Immune thrombocytopenic purpura: Secondary | ICD-10-CM | POA: Diagnosis not present

## 2020-01-04 LAB — CBC WITH DIFFERENTIAL/PLATELET
Abs Immature Granulocytes: 0.02 10*3/uL (ref 0.00–0.07)
Basophils Absolute: 0 10*3/uL (ref 0.0–0.1)
Basophils Relative: 0 %
Eosinophils Absolute: 0 10*3/uL (ref 0.0–0.5)
Eosinophils Relative: 0 %
HCT: 44.4 % (ref 36.0–46.0)
Hemoglobin: 14.2 g/dL (ref 12.0–15.0)
Immature Granulocytes: 0 %
Lymphocytes Relative: 24 %
Lymphs Abs: 2.1 10*3/uL (ref 0.7–4.0)
MCH: 29.6 pg (ref 26.0–34.0)
MCHC: 32 g/dL (ref 30.0–36.0)
MCV: 92.5 fL (ref 80.0–100.0)
Monocytes Absolute: 0.6 10*3/uL (ref 0.1–1.0)
Monocytes Relative: 6 %
Neutro Abs: 6 10*3/uL (ref 1.7–7.7)
Neutrophils Relative %: 70 %
Platelets: 150 10*3/uL (ref 150–400)
RBC: 4.8 MIL/uL (ref 3.87–5.11)
RDW: 14.5 % (ref 11.5–15.5)
WBC: 8.7 10*3/uL (ref 4.0–10.5)
nRBC: 0 % (ref 0.0–0.2)

## 2020-01-04 LAB — MISC LABCORP TEST (SEND OUT)
LabCorp test name: HIGH
Labcorp test code: 500460
Labcorp test code: 500194

## 2020-01-04 NOTE — Telephone Encounter (Signed)
Patient aware to decrease to 10mg  of prednisone a day on 5/21 per Dr Mike Gip

## 2020-01-09 ENCOUNTER — Other Ambulatory Visit: Payer: 59

## 2020-01-11 ENCOUNTER — Inpatient Hospital Stay: Payer: 59

## 2020-01-11 ENCOUNTER — Other Ambulatory Visit: Payer: Self-pay

## 2020-01-11 ENCOUNTER — Telehealth: Payer: Self-pay

## 2020-01-11 DIAGNOSIS — D693 Immune thrombocytopenic purpura: Secondary | ICD-10-CM | POA: Diagnosis not present

## 2020-01-11 LAB — CBC WITH DIFFERENTIAL/PLATELET
Abs Immature Granulocytes: 0.02 10*3/uL (ref 0.00–0.07)
Basophils Absolute: 0 10*3/uL (ref 0.0–0.1)
Basophils Relative: 0 %
Eosinophils Absolute: 0 10*3/uL (ref 0.0–0.5)
Eosinophils Relative: 0 %
HCT: 45.1 % (ref 36.0–46.0)
Hemoglobin: 14.2 g/dL (ref 12.0–15.0)
Immature Granulocytes: 0 %
Lymphocytes Relative: 18 %
Lymphs Abs: 1.3 10*3/uL (ref 0.7–4.0)
MCH: 29.3 pg (ref 26.0–34.0)
MCHC: 31.5 g/dL (ref 30.0–36.0)
MCV: 93 fL (ref 80.0–100.0)
Monocytes Absolute: 0.5 10*3/uL (ref 0.1–1.0)
Monocytes Relative: 7 %
Neutro Abs: 5.2 10*3/uL (ref 1.7–7.7)
Neutrophils Relative %: 75 %
Platelets: 156 10*3/uL (ref 150–400)
RBC: 4.85 MIL/uL (ref 3.87–5.11)
RDW: 14.3 % (ref 11.5–15.5)
WBC: 7 10*3/uL (ref 4.0–10.5)
nRBC: 0 % (ref 0.0–0.2)

## 2020-01-11 MED ORDER — PREDNISONE 5 MG PO TABS: 5.0000 mg | ORAL_TABLET | Freq: Every day | ORAL | 0 refills | Status: DC

## 2020-01-11 NOTE — Telephone Encounter (Signed)
error 
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
## 2020-01-11 NOTE — Telephone Encounter (Signed)
Advised patient to start 5mg  of prednisone on Friday 01/13/20 per Dr Mike Gip. Platelets at 156 today. Patient states that she only has 20mg  tablets so it was easy to decrease to 10mg  but now she is wondering should she take one fourth of the tablet or could a smaller dose tab be called in.

## 2020-01-18 ENCOUNTER — Inpatient Hospital Stay: Payer: 59 | Attending: Hematology and Oncology

## 2020-01-18 ENCOUNTER — Encounter: Payer: Self-pay | Admitting: Hematology and Oncology

## 2020-01-18 ENCOUNTER — Inpatient Hospital Stay (HOSPITAL_BASED_OUTPATIENT_CLINIC_OR_DEPARTMENT_OTHER): Payer: 59 | Admitting: Hematology and Oncology

## 2020-01-18 ENCOUNTER — Other Ambulatory Visit: Payer: Self-pay

## 2020-01-18 VITALS — BP 146/71 | HR 63 | Temp 96.9°F | Resp 18 | Wt 145.5 lb

## 2020-01-18 DIAGNOSIS — Z8042 Family history of malignant neoplasm of prostate: Secondary | ICD-10-CM | POA: Diagnosis not present

## 2020-01-18 DIAGNOSIS — Z8249 Family history of ischemic heart disease and other diseases of the circulatory system: Secondary | ICD-10-CM | POA: Insufficient documentation

## 2020-01-18 DIAGNOSIS — Z7952 Long term (current) use of systemic steroids: Secondary | ICD-10-CM | POA: Diagnosis not present

## 2020-01-18 DIAGNOSIS — D682 Hereditary deficiency of other clotting factors: Secondary | ICD-10-CM | POA: Diagnosis not present

## 2020-01-18 DIAGNOSIS — D693 Immune thrombocytopenic purpura: Secondary | ICD-10-CM | POA: Diagnosis present

## 2020-01-18 DIAGNOSIS — Z79899 Other long term (current) drug therapy: Secondary | ICD-10-CM | POA: Diagnosis not present

## 2020-01-18 DIAGNOSIS — M329 Systemic lupus erythematosus, unspecified: Secondary | ICD-10-CM | POA: Diagnosis not present

## 2020-01-18 DIAGNOSIS — Z833 Family history of diabetes mellitus: Secondary | ICD-10-CM | POA: Insufficient documentation

## 2020-01-18 LAB — CBC WITH DIFFERENTIAL/PLATELET
Abs Immature Granulocytes: 0.01 10*3/uL (ref 0.00–0.07)
Basophils Absolute: 0 10*3/uL (ref 0.0–0.1)
Basophils Relative: 0 %
Eosinophils Absolute: 0 10*3/uL (ref 0.0–0.5)
Eosinophils Relative: 1 %
HCT: 44.2 % (ref 36.0–46.0)
Hemoglobin: 14.2 g/dL (ref 12.0–15.0)
Immature Granulocytes: 0 %
Lymphocytes Relative: 14 %
Lymphs Abs: 0.8 10*3/uL (ref 0.7–4.0)
MCH: 29.7 pg (ref 26.0–34.0)
MCHC: 32.1 g/dL (ref 30.0–36.0)
MCV: 92.5 fL (ref 80.0–100.0)
Monocytes Absolute: 0.4 10*3/uL (ref 0.1–1.0)
Monocytes Relative: 7 %
Neutro Abs: 4.7 10*3/uL (ref 1.7–7.7)
Neutrophils Relative %: 78 %
Platelets: 168 10*3/uL (ref 150–400)
RBC: 4.78 MIL/uL (ref 3.87–5.11)
RDW: 14.3 % (ref 11.5–15.5)
WBC: 6 10*3/uL (ref 4.0–10.5)
nRBC: 0 % (ref 0.0–0.2)

## 2020-01-18 NOTE — Patient Instructions (Signed)
  Decrease prednisone to 5 mg every other day for 2 weeks then stop.

## 2020-02-01 ENCOUNTER — Other Ambulatory Visit: Payer: Self-pay

## 2020-02-01 ENCOUNTER — Inpatient Hospital Stay: Payer: 59

## 2020-02-01 DIAGNOSIS — D693 Immune thrombocytopenic purpura: Secondary | ICD-10-CM

## 2020-02-01 DIAGNOSIS — D682 Hereditary deficiency of other clotting factors: Secondary | ICD-10-CM

## 2020-02-01 LAB — CBC WITH DIFFERENTIAL/PLATELET
Abs Immature Granulocytes: 0.01 10*3/uL (ref 0.00–0.07)
Basophils Absolute: 0 10*3/uL (ref 0.0–0.1)
Basophils Relative: 0 %
Eosinophils Absolute: 0 10*3/uL (ref 0.0–0.5)
Eosinophils Relative: 1 %
HCT: 43.9 % (ref 36.0–46.0)
Hemoglobin: 14.3 g/dL (ref 12.0–15.0)
Immature Granulocytes: 0 %
Lymphocytes Relative: 21 %
Lymphs Abs: 0.9 10*3/uL (ref 0.7–4.0)
MCH: 29.9 pg (ref 26.0–34.0)
MCHC: 32.6 g/dL (ref 30.0–36.0)
MCV: 91.8 fL (ref 80.0–100.0)
Monocytes Absolute: 0.5 10*3/uL (ref 0.1–1.0)
Monocytes Relative: 11 %
Neutro Abs: 3 10*3/uL (ref 1.7–7.7)
Neutrophils Relative %: 67 %
Platelets: 156 10*3/uL (ref 150–400)
RBC: 4.78 MIL/uL (ref 3.87–5.11)
RDW: 13.8 % (ref 11.5–15.5)
WBC: 4.4 10*3/uL (ref 4.0–10.5)
nRBC: 0 % (ref 0.0–0.2)

## 2020-02-04 ENCOUNTER — Other Ambulatory Visit: Payer: Self-pay | Admitting: Hematology and Oncology

## 2020-02-06 ENCOUNTER — Telehealth: Payer: Self-pay

## 2020-02-29 ENCOUNTER — Ambulatory Visit: Payer: 59 | Admitting: Nurse Practitioner

## 2020-02-29 ENCOUNTER — Inpatient Hospital Stay: Payer: 59 | Attending: Nurse Practitioner

## 2020-02-29 ENCOUNTER — Other Ambulatory Visit: Payer: Self-pay

## 2020-02-29 DIAGNOSIS — D693 Immune thrombocytopenic purpura: Secondary | ICD-10-CM | POA: Diagnosis not present

## 2020-02-29 DIAGNOSIS — D682 Hereditary deficiency of other clotting factors: Secondary | ICD-10-CM

## 2020-02-29 LAB — COMPREHENSIVE METABOLIC PANEL
ALT: 20 U/L (ref 0–44)
AST: 22 U/L (ref 15–41)
Albumin: 4.4 g/dL (ref 3.5–5.0)
Alkaline Phosphatase: 75 U/L (ref 38–126)
Anion gap: 9 (ref 5–15)
BUN: 17 mg/dL (ref 8–23)
CO2: 28 mmol/L (ref 22–32)
Calcium: 9.1 mg/dL (ref 8.9–10.3)
Chloride: 103 mmol/L (ref 98–111)
Creatinine, Ser: 0.86 mg/dL (ref 0.44–1.00)
GFR calc Af Amer: >60 mL/min (ref >60)
GFR calc non Af Amer: >60 mL/min (ref >60)
Glucose, Bld: 122 mg/dL — ABNORMAL HIGH (ref 70–99)
Potassium: 4.7 mmol/L (ref 3.5–5.1)
Sodium: 140 mmol/L (ref 135–145)
Total Bilirubin: 1.1 mg/dL (ref 0.3–1.2)
Total Protein: 7.1 g/dL (ref 6.5–8.1)

## 2020-02-29 LAB — CBC WITH DIFFERENTIAL/PLATELET
Abs Immature Granulocytes: 0.01 10*3/uL (ref 0.00–0.07)
Basophils Absolute: 0 10*3/uL (ref 0.0–0.1)
Basophils Relative: 0 %
Eosinophils Absolute: 0.1 10*3/uL (ref 0.0–0.5)
Eosinophils Relative: 1 %
HCT: 43.6 % (ref 36.0–46.0)
Hemoglobin: 14.2 g/dL (ref 12.0–15.0)
Immature Granulocytes: 0 %
Lymphocytes Relative: 23 %
Lymphs Abs: 1 10*3/uL (ref 0.7–4.0)
MCH: 29.5 pg (ref 26.0–34.0)
MCHC: 32.6 g/dL (ref 30.0–36.0)
MCV: 90.5 fL (ref 80.0–100.0)
Monocytes Absolute: 0.4 10*3/uL (ref 0.1–1.0)
Monocytes Relative: 10 %
Neutro Abs: 2.8 10*3/uL (ref 1.7–7.7)
Neutrophils Relative %: 66 %
Platelets: 147 10*3/uL — ABNORMAL LOW (ref 150–400)
RBC: 4.82 MIL/uL (ref 3.87–5.11)
RDW: 13.3 % (ref 11.5–15.5)
WBC: 4.2 10*3/uL (ref 4.0–10.5)
nRBC: 0 % (ref 0.0–0.2)

## 2020-03-01 LAB — ANTI-DNA ANTIBODY, DOUBLE-STRANDED: ds DNA Ab: 2 IU/mL (ref 0–9)

## 2020-03-01 LAB — C4 COMPLEMENT: Complement C4, Body Fluid: 27 mg/dL (ref 12–38)

## 2020-03-01 LAB — C3 COMPLEMENT: C3 Complement: 98 mg/dL (ref 82–167)

## 2020-03-02 ENCOUNTER — Telehealth: Payer: Self-pay

## 2020-03-02 NOTE — Telephone Encounter (Signed)
-----   Message from Lequita Asal, MD sent at 03/02/2020  1:34 AM EDT ----- Regarding: Please send additional results to her rheumatologist.  ----- Message ----- From: Buel Ream, [facility] [place] Sent: 02/29/2020   9:02 AM EDT To: Lequita Asal, MD

## 2020-03-02 NOTE — Telephone Encounter (Signed)
The patient labs has been routed to the patient Rhematologist office.
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
## 2020-04-18 ENCOUNTER — Other Ambulatory Visit: Payer: Self-pay

## 2020-04-18 ENCOUNTER — Ambulatory Visit: Payer: 59 | Admitting: Hematology and Oncology

## 2020-04-18 ENCOUNTER — Encounter: Payer: Self-pay | Admitting: Hematology and Oncology

## 2020-04-18 ENCOUNTER — Other Ambulatory Visit: Payer: 59

## 2020-04-18 ENCOUNTER — Inpatient Hospital Stay: Payer: 59 | Attending: Hematology and Oncology

## 2020-04-18 ENCOUNTER — Inpatient Hospital Stay (HOSPITAL_BASED_OUTPATIENT_CLINIC_OR_DEPARTMENT_OTHER): Payer: 59 | Admitting: Hematology and Oncology

## 2020-04-18 ENCOUNTER — Telehealth: Payer: Self-pay

## 2020-04-18 VITALS — BP 149/76 | HR 64 | Temp 97.7°F | Resp 18 | Wt 141.1 lb

## 2020-04-18 DIAGNOSIS — D693 Immune thrombocytopenic purpura: Secondary | ICD-10-CM

## 2020-04-18 DIAGNOSIS — D682 Hereditary deficiency of other clotting factors: Secondary | ICD-10-CM

## 2020-04-18 DIAGNOSIS — M329 Systemic lupus erythematosus, unspecified: Secondary | ICD-10-CM | POA: Diagnosis not present

## 2020-04-18 LAB — CBC WITH DIFFERENTIAL/PLATELET
Abs Immature Granulocytes: 0.01 10*3/uL (ref 0.00–0.07)
Basophils Absolute: 0 10*3/uL (ref 0.0–0.1)
Basophils Relative: 0 %
Eosinophils Absolute: 0.1 10*3/uL (ref 0.0–0.5)
Eosinophils Relative: 2 %
HCT: 43.2 % (ref 36.0–46.0)
Hemoglobin: 14.1 g/dL (ref 12.0–15.0)
Immature Granulocytes: 0 %
Lymphocytes Relative: 20 %
Lymphs Abs: 1 10*3/uL (ref 0.7–4.0)
MCH: 29.7 pg (ref 26.0–34.0)
MCHC: 32.6 g/dL (ref 30.0–36.0)
MCV: 90.9 fL (ref 80.0–100.0)
Monocytes Absolute: 0.4 10*3/uL (ref 0.1–1.0)
Monocytes Relative: 9 %
Neutro Abs: 3.4 10*3/uL (ref 1.7–7.7)
Neutrophils Relative %: 69 %
Platelets: 104 10*3/uL — ABNORMAL LOW (ref 150–400)
RBC: 4.75 MIL/uL (ref 3.87–5.11)
RDW: 13.2 % (ref 11.5–15.5)
WBC: 4.9 10*3/uL (ref 4.0–10.5)
nRBC: 0 % (ref 0.0–0.2)

## 2020-04-18 NOTE — Telephone Encounter (Signed)
Left Message To inform the patient that her platelet counts was 104 ,000 with labs and Per Dr Mike Gip she will like to repeat labs in 2 week. I have informed ms robin to get the patient schedule.

## 2020-05-02 ENCOUNTER — Inpatient Hospital Stay: Payer: 59

## 2020-05-02 ENCOUNTER — Other Ambulatory Visit: Payer: Self-pay

## 2020-05-02 DIAGNOSIS — D693 Immune thrombocytopenic purpura: Secondary | ICD-10-CM

## 2020-05-02 LAB — CBC WITH DIFFERENTIAL/PLATELET
Abs Immature Granulocytes: 0.01 10*3/uL (ref 0.00–0.07)
Basophils Absolute: 0 10*3/uL (ref 0.0–0.1)
Basophils Relative: 0 %
Eosinophils Absolute: 0.2 10*3/uL (ref 0.0–0.5)
Eosinophils Relative: 4 %
HCT: 41.7 % (ref 36.0–46.0)
Hemoglobin: 13.6 g/dL (ref 12.0–15.0)
Immature Granulocytes: 0 %
Lymphocytes Relative: 22 %
Lymphs Abs: 1 10*3/uL (ref 0.7–4.0)
MCH: 29.6 pg (ref 26.0–34.0)
MCHC: 32.6 g/dL (ref 30.0–36.0)
MCV: 90.8 fL (ref 80.0–100.0)
Monocytes Absolute: 0.5 10*3/uL (ref 0.1–1.0)
Monocytes Relative: 11 %
Neutro Abs: 2.9 10*3/uL (ref 1.7–7.7)
Neutrophils Relative %: 63 %
Platelets: 93 10*3/uL — ABNORMAL LOW (ref 150–400)
RBC: 4.59 MIL/uL (ref 3.87–5.11)
RDW: 13.3 % (ref 11.5–15.5)
WBC: 4.6 10*3/uL (ref 4.0–10.5)
nRBC: 0 % (ref 0.0–0.2)

## 2020-05-09 ENCOUNTER — Telehealth: Payer: Self-pay | Admitting: *Deleted

## 2020-05-09 ENCOUNTER — Telehealth: Payer: Self-pay

## 2020-05-09 ENCOUNTER — Other Ambulatory Visit: Payer: Self-pay

## 2020-05-09 DIAGNOSIS — D693 Immune thrombocytopenic purpura: Secondary | ICD-10-CM

## 2020-05-09 DIAGNOSIS — D696 Thrombocytopenia, unspecified: Secondary | ICD-10-CM

## 2020-05-09 NOTE — Telephone Encounter (Signed)
  Please call patient.  Platelet count adequate.  CBC in 2 weeks.  Call if any concern for excess bruising or bleeding.  M

## 2020-05-09 NOTE — Progress Notes (Unsigned)
bc

## 2020-05-09 NOTE — Telephone Encounter (Signed)
Patient called reporting that she noted her platelet count has dropped and is asking what she needs to do about it, If she needs to have recheck in 1 - 2 weeks or what. Please return her call   CBC with Differential/Platelet Order: [id number] Status:  Final result Visible to patient:  Yes (seen) Next appt:  07/18/2020 at 09:15 AM in Oncology ([facility]) Dx:  Chronic ITP (idiopathic thrombocytope...  0 Result Notes  Ref Range & Units 7 d ago 3 wk ago 2 mo ago 3 mo ago  WBC 4.0 - 10.5 K/uL 4.6  4.9  4.2  4.4   RBC 3.87 - 5.11 MIL/uL 4.59  4.75  4.82  4.78   Hemoglobin 12.0 - 15.0 g/dL 13.6  14.1  14.2  14.3   HCT 36 - 46 % 41.7  43.2  43.6  43.9   MCV 80.0 - 100.0 fL 90.8  90.9  90.5  91.8   MCH 26.0 - 34.0 pg 29.6  29.7  29.5  29.9   MCHC 30.0 - 36.0 g/dL 32.6  32.6  32.6  32.6   RDW 11.5 - 15.5 % 13.3  13.2  13.3  13.8   Platelets 150 - 400 K/uL 93Low  104Low CM  147Low  156   Comment: Immature Platelet Fraction may be  clinically indicated, consider  ordering this additional test  [id number]   nRBC 0.0 - 0.2 % 0.0  0.0  0.0  0.0   Neutrophils Relative % % 63  69  66  67   Neutro Abs 1.7 - 7.7 K/uL 2.9  3.4  2.8  3.0   Lymphocytes Relative % 22  20  23  21    Lymphs Abs 0.7 - 4.0 K/uL 1.0  1.0  1.0  0.9   Monocytes Relative % 11  9  10  11    Monocytes Absolute 0 - 1 K/uL 0.5  0.4  0.4  0.5   Eosinophils Relative % 4  2  1  1    Eosinophils Absolute 0 - 0 K/uL 0.2  0.1  0.1  0.0   Basophils Relative % 0  0  0  0   Basophils Absolute 0 - 0 K/uL 0.0  0.0  0.0  0.0   Immature Granulocytes % 0  0  0  0   Abs Immature Granulocytes 0.00 - 0.07 K/uL 0.01  0.01 CM  0.01 CM  0.01 CM   Comment: Performed at [facility], [address] [place], [place] [address]  Resulting Agency  [facility]      Specimen Collected: 05/02/20 09:21 Last Resulted: 05/02/20 09:47

## 2020-05-23 ENCOUNTER — Other Ambulatory Visit: Payer: Self-pay

## 2020-05-23 ENCOUNTER — Inpatient Hospital Stay: Payer: 59 | Attending: Hematology and Oncology

## 2020-05-23 DIAGNOSIS — D693 Immune thrombocytopenic purpura: Secondary | ICD-10-CM | POA: Insufficient documentation

## 2020-05-23 DIAGNOSIS — D696 Thrombocytopenia, unspecified: Secondary | ICD-10-CM

## 2020-05-23 LAB — CBC
HCT: 41.9 % (ref 36.0–46.0)
Hemoglobin: 13.6 g/dL (ref 12.0–15.0)
MCH: 29.2 pg (ref 26.0–34.0)
MCHC: 32.5 g/dL (ref 30.0–36.0)
MCV: 90.1 fL (ref 80.0–100.0)
Platelets: 123 10*3/uL — ABNORMAL LOW (ref 150–400)
RBC: 4.65 MIL/uL (ref 3.87–5.11)
RDW: 14 % (ref 11.5–15.5)
WBC: 4.5 10*3/uL (ref 4.0–10.5)
nRBC: 0 % (ref 0.0–0.2)

## 2020-06-20 ENCOUNTER — Other Ambulatory Visit: Payer: Self-pay

## 2020-06-20 ENCOUNTER — Inpatient Hospital Stay: Payer: 59 | Attending: Hematology and Oncology

## 2020-06-20 DIAGNOSIS — D696 Thrombocytopenia, unspecified: Secondary | ICD-10-CM

## 2020-06-20 LAB — CBC WITH DIFFERENTIAL/PLATELET
Abs Immature Granulocytes: 0.01 10*3/uL (ref 0.00–0.07)
Basophils Absolute: 0 10*3/uL (ref 0.0–0.1)
Basophils Relative: 0 %
Eosinophils Absolute: 0.1 10*3/uL (ref 0.0–0.5)
Eosinophils Relative: 2 %
HCT: 41.8 % (ref 36.0–46.0)
Hemoglobin: 13.6 g/dL (ref 12.0–15.0)
Immature Granulocytes: 0 %
Lymphocytes Relative: 21 %
Lymphs Abs: 0.9 10*3/uL (ref 0.7–4.0)
MCH: 29.3 pg (ref 26.0–34.0)
MCHC: 32.5 g/dL (ref 30.0–36.0)
MCV: 90.1 fL (ref 80.0–100.0)
Monocytes Absolute: 0.4 10*3/uL (ref 0.1–1.0)
Monocytes Relative: 9 %
Neutro Abs: 2.9 10*3/uL (ref 1.7–7.7)
Neutrophils Relative %: 68 %
Platelets: 123 10*3/uL — ABNORMAL LOW (ref 150–400)
RBC: 4.64 MIL/uL (ref 3.87–5.11)
RDW: 14.1 % (ref 11.5–15.5)
WBC: 4.3 10*3/uL (ref 4.0–10.5)
nRBC: 0 % (ref 0.0–0.2)

## 2020-07-18 ENCOUNTER — Telehealth: Payer: Self-pay

## 2020-07-18 ENCOUNTER — Other Ambulatory Visit: Payer: Self-pay

## 2020-07-18 ENCOUNTER — Inpatient Hospital Stay: Payer: 59 | Attending: Hematology and Oncology

## 2020-07-18 DIAGNOSIS — D696 Thrombocytopenia, unspecified: Secondary | ICD-10-CM | POA: Diagnosis present

## 2020-07-18 DIAGNOSIS — D693 Immune thrombocytopenic purpura: Secondary | ICD-10-CM

## 2020-07-18 LAB — CBC WITH DIFFERENTIAL/PLATELET
Abs Immature Granulocytes: 0.01 10*3/uL (ref 0.00–0.07)
Basophils Absolute: 0 10*3/uL (ref 0.0–0.1)
Basophils Relative: 0 %
Eosinophils Absolute: 0 10*3/uL (ref 0.0–0.5)
Eosinophils Relative: 1 %
HCT: 41.6 % (ref 36.0–46.0)
Hemoglobin: 13.6 g/dL (ref 12.0–15.0)
Immature Granulocytes: 0 %
Lymphocytes Relative: 20 %
Lymphs Abs: 0.8 10*3/uL (ref 0.7–4.0)
MCH: 29.2 pg (ref 26.0–34.0)
MCHC: 32.7 g/dL (ref 30.0–36.0)
MCV: 89.3 fL (ref 80.0–100.0)
Monocytes Absolute: 0.4 10*3/uL (ref 0.1–1.0)
Monocytes Relative: 9 %
Neutro Abs: 3 10*3/uL (ref 1.7–7.7)
Neutrophils Relative %: 70 %
Platelets: 111 10*3/uL — ABNORMAL LOW (ref 150–400)
RBC: 4.66 MIL/uL (ref 3.87–5.11)
RDW: 13.9 % (ref 11.5–15.5)
WBC: 4.2 10*3/uL (ref 4.0–10.5)
nRBC: 0 % (ref 0.0–0.2)

## 2020-07-18 NOTE — Telephone Encounter (Signed)
Patient waited in the lobby for lab results. Copy given.

## 2020-07-18 NOTE — Telephone Encounter (Signed)
-----   Message from Lequita Asal, MD sent at 07/18/2020 10:38 AM EST ----- Regarding: Please notify patient of platelet count  ----- Message ----- From: Interface, Lab In [place] Sent: 07/18/2020  10:13 AM EST To: Lequita Asal, MD
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
## 2020-10-17 ENCOUNTER — Other Ambulatory Visit: Payer: Self-pay

## 2020-10-17 ENCOUNTER — Encounter: Payer: Self-pay | Admitting: Hematology and Oncology

## 2020-10-17 ENCOUNTER — Inpatient Hospital Stay: Payer: 59 | Attending: Hematology and Oncology | Admitting: Hematology and Oncology

## 2020-10-17 ENCOUNTER — Inpatient Hospital Stay: Payer: 59

## 2020-10-17 VITALS — BP 143/86 | HR 67 | Temp 97.2°F | Wt 143.6 lb

## 2020-10-17 DIAGNOSIS — Z8042 Family history of malignant neoplasm of prostate: Secondary | ICD-10-CM | POA: Insufficient documentation

## 2020-10-17 DIAGNOSIS — D693 Immune thrombocytopenic purpura: Secondary | ICD-10-CM

## 2020-10-17 DIAGNOSIS — Z8249 Family history of ischemic heart disease and other diseases of the circulatory system: Secondary | ICD-10-CM | POA: Diagnosis not present

## 2020-10-17 DIAGNOSIS — Z79899 Other long term (current) drug therapy: Secondary | ICD-10-CM | POA: Insufficient documentation

## 2020-10-17 DIAGNOSIS — D682 Hereditary deficiency of other clotting factors: Secondary | ICD-10-CM

## 2020-10-17 DIAGNOSIS — Z833 Family history of diabetes mellitus: Secondary | ICD-10-CM | POA: Insufficient documentation

## 2020-10-17 DIAGNOSIS — M329 Systemic lupus erythematosus, unspecified: Secondary | ICD-10-CM | POA: Diagnosis not present

## 2020-10-17 LAB — CBC WITH DIFFERENTIAL/PLATELET
Abs Immature Granulocytes: 0.01 10*3/uL (ref 0.00–0.07)
Basophils Absolute: 0 10*3/uL (ref 0.0–0.1)
Basophils Relative: 0 %
Eosinophils Absolute: 0.1 10*3/uL (ref 0.0–0.5)
Eosinophils Relative: 1 %
HCT: 42 % (ref 36.0–46.0)
Hemoglobin: 13.7 g/dL (ref 12.0–15.0)
Immature Granulocytes: 0 %
Lymphocytes Relative: 20 %
Lymphs Abs: 1 10*3/uL (ref 0.7–4.0)
MCH: 29.2 pg (ref 26.0–34.0)
MCHC: 32.6 g/dL (ref 30.0–36.0)
MCV: 89.6 fL (ref 80.0–100.0)
Monocytes Absolute: 0.4 10*3/uL (ref 0.1–1.0)
Monocytes Relative: 9 %
Neutro Abs: 3.3 10*3/uL (ref 1.7–7.7)
Neutrophils Relative %: 70 %
Platelets: 75 10*3/uL — ABNORMAL LOW (ref 150–400)
RBC: 4.69 MIL/uL (ref 3.87–5.11)
RDW: 14 % (ref 11.5–15.5)
WBC: 4.8 10*3/uL (ref 4.0–10.5)
nRBC: 0 % (ref 0.0–0.2)

## 2020-10-17 LAB — COMPREHENSIVE METABOLIC PANEL
ALT: 34 U/L (ref 0–44)
AST: 27 U/L (ref 15–41)
Albumin: 4.3 g/dL (ref 3.5–5.0)
Alkaline Phosphatase: 77 U/L (ref 38–126)
Anion gap: 10 (ref 5–15)
BUN: 18 mg/dL (ref 8–23)
CO2: 26 mmol/L (ref 22–32)
Calcium: 9.2 mg/dL (ref 8.9–10.3)
Chloride: 101 mmol/L (ref 98–111)
Creatinine, Ser: 0.85 mg/dL (ref 0.44–1.00)
GFR, Estimated: >60 mL/min (ref >60)
Glucose, Bld: 96 mg/dL (ref 70–99)
Potassium: 4.5 mmol/L (ref 3.5–5.1)
Sodium: 137 mmol/L (ref 135–145)
Total Bilirubin: 1.2 mg/dL (ref 0.3–1.2)
Total Protein: 7.4 g/dL (ref 6.5–8.1)

## 2020-10-31 ENCOUNTER — Inpatient Hospital Stay: Payer: 59

## 2020-10-31 ENCOUNTER — Other Ambulatory Visit: Payer: Self-pay

## 2020-10-31 ENCOUNTER — Telehealth: Payer: Self-pay

## 2020-10-31 DIAGNOSIS — D693 Immune thrombocytopenic purpura: Secondary | ICD-10-CM

## 2020-10-31 LAB — CBC WITH DIFFERENTIAL/PLATELET
Abs Immature Granulocytes: 0.01 10*3/uL (ref 0.00–0.07)
Basophils Absolute: 0 10*3/uL (ref 0.0–0.1)
Basophils Relative: 0 %
Eosinophils Absolute: 0 10*3/uL (ref 0.0–0.5)
Eosinophils Relative: 1 %
HCT: 41.6 % (ref 36.0–46.0)
Hemoglobin: 13.5 g/dL (ref 12.0–15.0)
Immature Granulocytes: 0 %
Lymphocytes Relative: 22 %
Lymphs Abs: 1 10*3/uL (ref 0.7–4.0)
MCH: 29.5 pg (ref 26.0–34.0)
MCHC: 32.5 g/dL (ref 30.0–36.0)
MCV: 90.8 fL (ref 80.0–100.0)
Monocytes Absolute: 0.5 10*3/uL (ref 0.1–1.0)
Monocytes Relative: 11 %
Neutro Abs: 3.2 10*3/uL (ref 1.7–7.7)
Neutrophils Relative %: 66 %
Platelets: 65 10*3/uL — ABNORMAL LOW (ref 150–400)
RBC: 4.58 MIL/uL (ref 3.87–5.11)
RDW: 14.1 % (ref 11.5–15.5)
WBC: 4.8 10*3/uL (ref 4.0–10.5)
nRBC: 0 % (ref 0.0–0.2)

## 2020-10-31 NOTE — Telephone Encounter (Signed)
Patient aware. She request that her 2 week lab check be on a Friday in [place]. Lab order in.

## 2020-10-31 NOTE — Telephone Encounter (Signed)
10/31/2020 Spoke w/ pt and informed her 2 week lab check per Dr. Loletha Grayer was scheduled for 4/1 @ 1:15 at [facility]. Pt confirmed appt  SRW

## 2020-10-31 NOTE — Telephone Encounter (Signed)
-----   Message from Lequita Asal, MD sent at 10/31/2020 12:58 PM EDT ----- Regarding: Please call patient  Platelet count has drifted down to 65,000.  OK to continue to observe.  Check CBC in 2 weeks.  Call if any increased bruising or bleeding.  M  ----- Message ----- From: Buel Ream, Lab In [place] Sent: 10/31/2020   9:33 AM EDT To: Lequita Asal, MD

## 2020-11-16 ENCOUNTER — Other Ambulatory Visit: Payer: Self-pay

## 2020-11-16 ENCOUNTER — Inpatient Hospital Stay: Payer: 59 | Attending: Hematology and Oncology

## 2020-11-16 DIAGNOSIS — Z8042 Family history of malignant neoplasm of prostate: Secondary | ICD-10-CM | POA: Diagnosis not present

## 2020-11-16 DIAGNOSIS — D693 Immune thrombocytopenic purpura: Secondary | ICD-10-CM | POA: Insufficient documentation

## 2020-11-16 DIAGNOSIS — Z8249 Family history of ischemic heart disease and other diseases of the circulatory system: Secondary | ICD-10-CM | POA: Insufficient documentation

## 2020-11-16 DIAGNOSIS — Z79899 Other long term (current) drug therapy: Secondary | ICD-10-CM | POA: Insufficient documentation

## 2020-11-16 DIAGNOSIS — Z833 Family history of diabetes mellitus: Secondary | ICD-10-CM | POA: Insufficient documentation

## 2020-11-16 DIAGNOSIS — M329 Systemic lupus erythematosus, unspecified: Secondary | ICD-10-CM | POA: Diagnosis not present

## 2020-11-16 LAB — CBC
HCT: 41.3 % (ref 36.0–46.0)
Hemoglobin: 13.7 g/dL (ref 12.0–15.0)
MCH: 30 pg (ref 26.0–34.0)
MCHC: 33.2 g/dL (ref 30.0–36.0)
MCV: 90.4 fL (ref 80.0–100.0)
Platelets: 77 10*3/uL — ABNORMAL LOW (ref 150–400)
RBC: 4.57 MIL/uL (ref 3.87–5.11)
RDW: 13.8 % (ref 11.5–15.5)
WBC: 4.1 10*3/uL (ref 4.0–10.5)
nRBC: 0 % (ref 0.0–0.2)

## 2020-11-16 LAB — HEPATITIS B CORE ANTIBODY, TOTAL: Hep B Core Total Ab: NONREACTIVE

## 2020-11-16 LAB — HEPATITIS C ANTIBODY: HCV Ab: NONREACTIVE

## 2020-11-16 LAB — HEPATITIS B SURFACE ANTIGEN: Hepatitis B Surface Ag: NONREACTIVE

## 2021-01-16 ENCOUNTER — Other Ambulatory Visit: Payer: 59

## 2021-01-16 ENCOUNTER — Other Ambulatory Visit: Payer: Self-pay

## 2021-01-16 DIAGNOSIS — D693 Immune thrombocytopenic purpura: Secondary | ICD-10-CM

## 2021-01-18 ENCOUNTER — Other Ambulatory Visit: Payer: 59

## 2021-01-18 ENCOUNTER — Other Ambulatory Visit: Payer: Self-pay

## 2021-01-18 ENCOUNTER — Inpatient Hospital Stay: Payer: 59 | Attending: Internal Medicine

## 2021-01-18 DIAGNOSIS — D693 Immune thrombocytopenic purpura: Secondary | ICD-10-CM | POA: Insufficient documentation

## 2021-01-18 LAB — CBC WITH DIFFERENTIAL/PLATELET
Abs Immature Granulocytes: 0.01 10*3/uL (ref 0.00–0.07)
Basophils Absolute: 0 10*3/uL (ref 0.0–0.1)
Basophils Relative: 0 %
Eosinophils Absolute: 0.1 10*3/uL (ref 0.0–0.5)
Eosinophils Relative: 1 %
HCT: 40.6 % (ref 36.0–46.0)
Hemoglobin: 13.2 g/dL (ref 12.0–15.0)
Immature Granulocytes: 0 %
Lymphocytes Relative: 21 %
Lymphs Abs: 0.9 10*3/uL (ref 0.7–4.0)
MCH: 29.3 pg (ref 26.0–34.0)
MCHC: 32.5 g/dL (ref 30.0–36.0)
MCV: 90.2 fL (ref 80.0–100.0)
Monocytes Absolute: 0.5 10*3/uL (ref 0.1–1.0)
Monocytes Relative: 12 %
Neutro Abs: 2.7 10*3/uL (ref 1.7–7.7)
Neutrophils Relative %: 66 %
Platelets: 62 10*3/uL — ABNORMAL LOW (ref 150–400)
RBC: 4.5 MIL/uL (ref 3.87–5.11)
RDW: 13.6 % (ref 11.5–15.5)
WBC: 4.1 10*3/uL (ref 4.0–10.5)
nRBC: 0 % (ref 0.0–0.2)

## 2021-04-24 ENCOUNTER — Ambulatory Visit: Payer: 59 | Admitting: Hematology and Oncology

## 2021-04-24 ENCOUNTER — Other Ambulatory Visit: Payer: 59

## 2021-04-25 ENCOUNTER — Ambulatory Visit: Payer: Self-pay | Admitting: Hematology and Oncology

## 2021-04-25 ENCOUNTER — Other Ambulatory Visit: Payer: 59

## 2021-04-26 ENCOUNTER — Encounter: Payer: Self-pay | Admitting: Nurse Practitioner

## 2021-04-26 ENCOUNTER — Inpatient Hospital Stay: Payer: 59 | Attending: Nurse Practitioner

## 2021-04-26 ENCOUNTER — Inpatient Hospital Stay (HOSPITAL_BASED_OUTPATIENT_CLINIC_OR_DEPARTMENT_OTHER): Payer: 59 | Admitting: Nurse Practitioner

## 2021-04-26 ENCOUNTER — Other Ambulatory Visit: Payer: Self-pay

## 2021-04-26 ENCOUNTER — Inpatient Hospital Stay
Admission: EM | Admit: 2021-04-26 | Discharge: 2021-04-28 | DRG: 813 | Disposition: A | Discharge status: Home / Self Care | Payer: 59 | Attending: Internal Medicine | Admitting: Internal Medicine

## 2021-04-26 VITALS — BP 160/74 | HR 72 | Temp 98.1°F | Resp 16 | Wt 144.0 lb

## 2021-04-26 DIAGNOSIS — Z886 Allergy status to analgesic agent status: Secondary | ICD-10-CM

## 2021-04-26 DIAGNOSIS — D696 Thrombocytopenia, unspecified: Secondary | ICD-10-CM | POA: Diagnosis not present

## 2021-04-26 DIAGNOSIS — D6959 Other secondary thrombocytopenia: Secondary | ICD-10-CM | POA: Diagnosis not present

## 2021-04-26 DIAGNOSIS — D682 Hereditary deficiency of other clotting factors: Secondary | ICD-10-CM | POA: Diagnosis not present

## 2021-04-26 DIAGNOSIS — Z91040 Latex allergy status: Secondary | ICD-10-CM

## 2021-04-26 DIAGNOSIS — Z20822 Contact with and (suspected) exposure to covid-19: Secondary | ICD-10-CM | POA: Diagnosis present

## 2021-04-26 DIAGNOSIS — E875 Hyperkalemia: Secondary | ICD-10-CM | POA: Diagnosis present

## 2021-04-26 DIAGNOSIS — D693 Immune thrombocytopenic purpura: Secondary | ICD-10-CM | POA: Diagnosis present

## 2021-04-26 DIAGNOSIS — R791 Abnormal coagulation profile: Secondary | ICD-10-CM

## 2021-04-26 DIAGNOSIS — M329 Systemic lupus erythematosus, unspecified: Secondary | ICD-10-CM | POA: Diagnosis present

## 2021-04-26 DIAGNOSIS — Z885 Allergy status to narcotic agent status: Secondary | ICD-10-CM

## 2021-04-26 DIAGNOSIS — Z888 Allergy status to other drugs, medicaments and biological substances status: Secondary | ICD-10-CM

## 2021-04-26 DIAGNOSIS — D72819 Decreased white blood cell count, unspecified: Secondary | ICD-10-CM | POA: Diagnosis present

## 2021-04-26 DIAGNOSIS — Z79899 Other long term (current) drug therapy: Secondary | ICD-10-CM

## 2021-04-26 LAB — CBC WITH DIFFERENTIAL/PLATELET
Abs Immature Granulocytes: 0.01 10*3/uL (ref 0.00–0.07)
Basophils Absolute: 0 10*3/uL (ref 0.0–0.1)
Basophils Relative: 1 %
Eosinophils Absolute: 0.1 10*3/uL (ref 0.0–0.5)
Eosinophils Relative: 2 %
HCT: 37.2 % (ref 36.0–46.0)
Hemoglobin: 12.1 g/dL (ref 12.0–15.0)
Immature Granulocytes: 0 %
Lymphocytes Relative: 23 %
Lymphs Abs: 0.9 10*3/uL (ref 0.7–4.0)
MCH: 29.8 pg (ref 26.0–34.0)
MCHC: 32.5 g/dL (ref 30.0–36.0)
MCV: 91.6 fL (ref 80.0–100.0)
Monocytes Absolute: 0.4 10*3/uL (ref 0.1–1.0)
Monocytes Relative: 11 %
Neutro Abs: 2.5 10*3/uL (ref 1.7–7.7)
Neutrophils Relative %: 63 %
Platelets: 9 10*3/uL — CL (ref 150–400)
RBC: 4.06 MIL/uL (ref 3.87–5.11)
RDW: 13.8 % (ref 11.5–15.5)
WBC: 3.9 10*3/uL — ABNORMAL LOW (ref 4.0–10.5)
nRBC: 0 % (ref 0.0–0.2)

## 2021-04-26 LAB — TECHNOLOGIST SMEAR REVIEW: Plt Morphology: NORMAL

## 2021-04-26 LAB — COMPREHENSIVE METABOLIC PANEL
ALT: 29 U/L (ref 0–44)
AST: 22 U/L (ref 15–41)
Albumin: 4.2 g/dL (ref 3.5–5.0)
Alkaline Phosphatase: 75 U/L (ref 38–126)
Anion gap: 6 (ref 5–15)
BUN: 24 mg/dL — ABNORMAL HIGH (ref 8–23)
CO2: 26 mmol/L (ref 22–32)
Calcium: 8.7 mg/dL — ABNORMAL LOW (ref 8.9–10.3)
Chloride: 102 mmol/L (ref 98–111)
Creatinine, Ser: 0.96 mg/dL (ref 0.44–1.00)
GFR, Estimated: >60 mL/min (ref >60)
Glucose, Bld: 119 mg/dL — ABNORMAL HIGH (ref 70–99)
Potassium: 4.6 mmol/L (ref 3.5–5.1)
Sodium: 134 mmol/L — ABNORMAL LOW (ref 135–145)
Total Bilirubin: 0.9 mg/dL (ref 0.3–1.2)
Total Protein: 7.3 g/dL (ref 6.5–8.1)

## 2021-04-26 LAB — CBC
HCT: 38.6 % (ref 36.0–46.0)
Hemoglobin: 12.8 g/dL (ref 12.0–15.0)
MCH: 30.3 pg (ref 26.0–34.0)
MCHC: 33.2 g/dL (ref 30.0–36.0)
MCV: 91.5 fL (ref 80.0–100.0)
Platelets: 11 10*3/uL — CL (ref 150–400)
RBC: 4.22 MIL/uL (ref 3.87–5.11)
RDW: 13.6 % (ref 11.5–15.5)
WBC: 4.5 10*3/uL (ref 4.0–10.5)
nRBC: 0 % (ref 0.0–0.2)

## 2021-04-26 LAB — IMMATURE PLATELET FRACTION: Immature Platelet Fraction: 28.3 % — ABNORMAL HIGH (ref 1.2–8.6)

## 2021-04-26 LAB — BASIC METABOLIC PANEL
Anion gap: 5 (ref 5–15)
BUN: 23 mg/dL (ref 8–23)
CO2: 28 mmol/L (ref 22–32)
Calcium: 9 mg/dL (ref 8.9–10.3)
Chloride: 106 mmol/L (ref 98–111)
Creatinine, Ser: 0.88 mg/dL (ref 0.44–1.00)
GFR, Estimated: >60 mL/min (ref >60)
Glucose, Bld: 114 mg/dL — ABNORMAL HIGH (ref 70–99)
Potassium: 4.2 mmol/L (ref 3.5–5.1)
Sodium: 139 mmol/L (ref 135–145)

## 2021-04-26 LAB — LACTATE DEHYDROGENASE: LDH: 87 U/L — ABNORMAL LOW (ref 98–192)

## 2021-04-26 LAB — PROTIME-INR
INR: 1.1 (ref 0.8–1.2)
Prothrombin Time: 13.9 seconds (ref 11.4–15.2)

## 2021-04-26 LAB — FIBRINOGEN: Fibrinogen: 377 mg/dL (ref 210–475)

## 2021-04-26 LAB — CBG MONITORING, ED: Glucose-Capillary: 161 mg/dL — ABNORMAL HIGH (ref 70–99)

## 2021-04-26 LAB — APTT: aPTT: 161 seconds (ref 24–36)

## 2021-04-26 MED ORDER — INSULIN ASPART 100 UNIT/ML IJ SOLN: 0.0000 [IU] | Freq: Every day | INTRAMUSCULAR | Status: DC

## 2021-04-26 MED ORDER — DIPHENHYDRAMINE HCL 25 MG PO CAPS
25.0000 mg | ORAL_CAPSULE | Freq: Three times a day (TID) | ORAL | Status: DC | PRN
Start: 2021-04-26 — End: 2021-04-29

## 2021-04-26 MED ORDER — ADULT MULTIVITAMIN W/MINERALS CH
1.0000 | ORAL_TABLET | Freq: Every day | ORAL | Status: DC
  Administered 2021-04-27 – 2021-04-28 (×2): 1 via ORAL
  Filled 2021-04-26 (×2): qty 1

## 2021-04-26 MED ORDER — INSULIN ASPART 100 UNIT/ML IJ SOLN
0.0000 [IU] | Freq: Three times a day (TID) | INTRAMUSCULAR | Status: DC
  Filled 2021-04-26: qty 1

## 2021-04-26 MED ORDER — ONDANSETRON HCL 4 MG PO TABS
4.0000 mg | ORAL_TABLET | Freq: Four times a day (QID) | ORAL | Status: DC | PRN
Start: 2021-04-26 — End: 2021-04-29

## 2021-04-26 MED ORDER — ACETAMINOPHEN 650 MG RE SUPP: 650.0000 mg | Freq: Four times a day (QID) | RECTAL | Status: DC | PRN

## 2021-04-26 MED ORDER — DEXAMETHASONE SODIUM PHOSPHATE 10 MG/ML IJ SOLN
40.0000 mg | Freq: Once | INTRAMUSCULAR | Status: AC
  Administered 2021-04-26: 40 mg via INTRAVENOUS
  Filled 2021-04-26: qty 4

## 2021-04-26 MED ORDER — ACETAMINOPHEN 325 MG PO TABS: 650.0000 mg | ORAL_TABLET | Freq: Four times a day (QID) | ORAL | Status: DC | PRN

## 2021-04-26 MED ORDER — HYDRALAZINE HCL 10 MG PO TABS
10.0000 mg | ORAL_TABLET | Freq: Three times a day (TID) | ORAL | Status: DC | PRN
  Filled 2021-04-26: qty 1

## 2021-04-26 MED ORDER — ONDANSETRON HCL 4 MG/2ML IJ SOLN
4.0000 mg | Freq: Four times a day (QID) | INTRAMUSCULAR | Status: DC | PRN
Start: 2021-04-26 — End: 2021-04-29

## 2021-04-26 MED ORDER — HYDROXYCHLOROQUINE SULFATE 200 MG PO TABS
200.0000 mg | ORAL_TABLET | Freq: Every day | ORAL | Status: DC
  Administered 2021-04-28: 200 mg via ORAL
  Filled 2021-04-26 (×3): qty 1

## 2021-04-26 NOTE — H&P (Signed)
History and Physical   GERAL TUCH WUJ:[id number] DOB: Jun 03, 1961 DOA: 04/26/2021  PCP: Blima Dessert, MD  Outpatient Specialists: [name], rheumatology Patient coming from: Home  I have personally briefly reviewed patient's old medical records in [place].  Chief Concern: Abnormal lab  HPI: Dana [name] is a 60 y.o. female with medical history significant for systemic lupus, who presents from home at the urging of her oncology/hematology office for chief concerns of abnormal labs.  At bedside she is able to tell me her name, age, location, and current calendar year.  She denies chest pain, bleeding, gum bleeding, dysuria, hematuria, vaginal discharge, vaginal bleeding, abdominal pain, shortness of breath, dizziness, syncopal event, loss of consciousness.  Patient was her normal state of health when she presented to the hospital.  Social history: She lives with a friend/roommate. She is not a tobacco user. The infrequently consumes, one glass of wine, one week ago. She denies recreational drug use. She currently works as a Pharmacist, community.   Vaccination history: She is vaccinated for covid 19, two doses of Pfizer  ROS: Constitutional: no weight change, no fever ENT/Mouth: no sore throat, no rhinorrhea Eyes: no eye pain, no vision changes Cardiovascular: no chest pain, no dyspnea,  no edema, no palpitations Respiratory: no cough, no sputum, no wheezing Gastrointestinal: no nausea, no vomiting, no diarrhea, no constipation Genitourinary: no urinary incontinence, no dysuria, no hematuria Musculoskeletal: no arthralgias, no myalgias Skin: no skin lesions, no pruritus, Neuro: + weakness, no loss of consciousness, no syncope Psych: no anxiety, no depression, + decrease appetite Heme/Lymph: no bruising, no bleeding  ED Course: Discussed with emergency medicine provider, patient requiring hospitalization for chief concerns of platelets of 9.  Vitals in the emergency  department was remarkable for temperature 98.1, respiration rate of 16, blood pressure 160/74, SPO2 100% on room air.  Serum sodium 134, potassium 4.6, bicarb 26, chloride 102, BUN 24, serum creatinine of 0.96, nonfasting blood glucose 119.  WBC 3.9, hemoglobin 12.1, platelets of 9.  On repeat platelet improved to 11.  eGFR greater than 60.  ED provider called hematologist, Dr. Tasia Catchings who recommended Decadron 40 mg IV once.  Assessment/Plan  Active Problems:   Thrombocytopenia (HCC)   Disseminated lupus erythematosus (HCC)   Chronic ITP (idiopathic thrombocytopenic purpura) (HCC)   Elevated partial thromboplastin time (PTT)   Factor XII deficiency ([place])   # Severe thrombocytopenia  # History of chronic ITP - CBC in the AM - Dexamethasone 40 mg IV once per Dr. Tasia Catchings, medical hematologist/oncologist -Hematology/oncology has been consulted, we appreciate further recommendations, -LDH, fibrinogen ordered  # Factor XII deficiency  # Lupus-hydroxychloroquine 200 mg daily resumed  # Patient felt tingly and generalized malaise after Decadron dosing - Diphenhydramine 25 mg p.o. every 8 hours as needed for itching, allergies ordered, 2 doses  # Right posterior upper arm ecchymosis-present on admission - Appears in healing status - Size is approximately 2 x 3 cm  Chart reviewed.   DVT prophylaxis: TED hose Code Status: Full code Diet: Heart healthy Family Communication: No Disposition Plan: Pending clinical course Consults called: Oncology/hematology Admission status: MedSurg, observation, telemetry  Past Medical History:  Diagnosis Date   Eye exam normal 02/04/2017   Hemorrhoid    Hx of mammogram    ITP (idiopathic thrombocytopenic purpura)    Lupus (HCC)    Pap smear abnormality of cervix 12/03/2016   Positive colorectal cancer screening using Cologuard test 02/08/2017   Uterine fibroid    Past Surgical  History:  Procedure Laterality Date   CARPAL TUNNEL RELEASE      bilateral   PELVIC LAPAROSCOPY     Social History:  reports that she has never smoked. She has never used smokeless tobacco. She reports that she does not drink alcohol and does not use drugs.  Allergies  Allergen Reactions   Aspirin     Stomach issue   Ibuprofen    Latex Swelling    Patient said she can use latex gloves now.   Morphine And Related Nausea Only   Protonix [Pantoprazole Sodium] Other (See Comments)    Cramping in lower legs    Buprenorphine Hcl Nausea Only   Family History  Problem Relation Age of Onset   Hypertension Mother    Cancer Father        porstate   Diabetes Father    Hypertension Sister    Family history: Family history reviewed and not pertinent  Prior to Admission medications   Medication Sig Start Date End Date Taking? Authorizing Provider  acetaminophen (TYLENOL) 500 MG tablet Take 500 mg by mouth every 6 (six) hours as needed for mild pain, moderate pain, fever or headache.    Yes [provider]  hydroxychloroquine (PLAQUENIL) 200 MG tablet Take 1 tablet by mouth daily. 09/05/20  Yes [provider]  Multiple Vitamin (MULTIVITAMIN) tablet Take 1 tablet by mouth daily.   Yes [provider]   Physical Exam: Vitals:   04/26/21 1828  BP: (!) 161/84  Pulse: 73  Resp: 17  Temp: 98 F (36.7 C)  SpO2: 100%   Constitutional: appears age-appropriate, NAD, calm, comfortable Eyes: PERRL, lids and conjunctivae normal ENMT: Mucous membranes are moist. Posterior pharynx clear of any exudate or lesions. Age-appropriate dentition. Hearing appropriate Neck: normal, supple, no masses, no thyromegaly Respiratory: clear to auscultation bilaterally, no wheezing, no crackles. Normal respiratory effort. No accessory muscle use.  Cardiovascular: Regular rate and rhythm, no murmurs / rubs / gallops. No extremity edema. 2+ pedal pulses. No carotid bruits.  Abdomen: no tenderness, no masses palpated, no hepatosplenomegaly. Bowel sounds  positive.  Musculoskeletal: no clubbing / cyanosis. No joint deformity upper and lower extremities. Good ROM, no contractures, no atrophy. Normal muscle tone.  Skin: no rashes, lesions, ulcers. No induration. + ecchymosis in posterior elbow Neurologic: Sensation intact. Strength 5/5 in all 4.  Psychiatric: Normal judgment and insight. Alert and oriented x 3. Normal mood.   EKG: Not indicated at this time  Chest x-ray on Admission: Not indicated at this time  Labs on Admission: I have personally reviewed following labs CBC: Recent Labs  Lab 04/26/21 1423 04/26/21 1827  WBC 3.9* 4.5  NEUTROABS 2.5  --   HGB 12.1 12.8  HCT 37.2 38.6  MCV 91.6 91.5  PLT 9* 11*   Basic Metabolic Panel: Recent Labs  Lab 04/26/21 1423 04/26/21 1827  NA 134* 139  K 4.6 4.2  CL 102 106  CO2 26 28  GLUCOSE 119* 114*  BUN 24* 23  CREATININE 0.96 0.88  CALCIUM 8.7* 9.0   GFR: CrCl cannot be calculated (Unknown ideal weight.).  Liver Function Tests: Recent Labs  Lab 04/26/21 1423  AST 22  ALT 29  ALKPHOS 75  BILITOT 0.9  PROT 7.3  ALBUMIN 4.2   Urine analysis:    Component Value Date/Time   COLORURINE YELLOW (A) 04/07/2015 2020   APPEARANCEUR CLEAR (A) 04/07/2015 2020   LABSPEC 1.009 04/07/2015 2020   PHURINE 7.0 04/07/2015 2020  GLUCOSEU NEGATIVE 04/07/2015 2020   HGBUR NEGATIVE 04/07/2015 2020   BILIRUBINUR NEGATIVE 04/07/2015 2020   KETONESUR NEGATIVE 04/07/2015 2020   PROTEINUR NEGATIVE 04/07/2015 2020   NITRITE NEGATIVE 04/07/2015 2020   LEUKOCYTESUR NEGATIVE 04/07/2015 2020   Dr. Tobie Poet [facility]  If 7PM-7AM, please contact overnight-coverage provider If 7AM-7PM, please contact day coverage provider [email or web address]  04/26/2021, 8:47 PM

## 2021-04-26 NOTE — ED Triage Notes (Signed)
Pt comes with c/o abnormal labs. Pt states she was at [facility] and her platelets were 9000. Pt states unsure if she needs a transfusion.  Pt denies any pain or complaits.

## 2021-04-26 NOTE — ED Provider Notes (Signed)
[facility] Emergency Department Provider Note   ____________________________________________   None    (approximate)  I have reviewed the triage vital signs and the nursing notes.   HISTORY  Chief Complaint Abnormal Lab    HPI Dana [name] is a 60 y.o. female patient sent from the cancer center by nurse practitioner for low platelets.  Platelet count was 9000.  Patient is not having any fever chills or bleeding or other complaints.  She does have a bruise behind her right elbow its been there for couple days.  She had banged herself.       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   Idiopathic thrombocytopenic purpura (ITP) ([place]) 07/27/2015   Leg pain 07/26/2015   Idiopathic thrombocytopenic purpura ([place]) 04/10/2015   Thrombocytopenia ([place]) 04/07/2015   Fever 04/07/2015   Encounter for gynecological examination without abnormal finding 09/14/2012   Villonodular synovitis (pigmented), 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 Authorizing Provider  acetaminophen (TYLENOL) 500 MG tablet Take 500 mg by mouth  every 6 (six) hours as needed for mild pain, moderate pain, fever or headache.     [provider]  hydroxychloroquine (PLAQUENIL) 200 MG tablet Take 200 mg by mouth daily.     [provider]  Multiple Vitamin (MULTIVITAMIN) tablet Take 1 tablet by mouth daily.    [provider]    Allergies Aspirin, Ibuprofen, Latex, Morphine and related, Protonix [pantoprazole sodium], and Buprenorphine hcl  Family History  Problem Relation Age of Onset   Hypertension Mother    Cancer Father        porstate   Diabetes Father    Hypertension Sister     Social History Social History   Tobacco Use   Smoking status: Never   Smokeless tobacco: Never  Substance Use Topics   Alcohol use: No   Drug use: No    Review of Systems  Constitutional: No fever/chills Eyes: No visual changes. ENT: No sore throat. Cardiovascular: Denies chest pain. Respiratory: Denies shortness of breath. Gastrointestinal: No abdominal pain.  No nausea, no vomiting.  No diarrhea.  No constipation. Genitourinary: Negative for dysuria. Musculoskeletal: Negative for back pain. Skin: Negative for rash. Neurological: Negative for headaches, focal weakness   ____________________________________________   PHYSICAL EXAM:  VITAL SIGNS: ED Triage Vitals  Enc Vitals Group     BP 04/26/21 1828 (!) 161/84     Pulse Rate 04/26/21 1828 73     Resp 04/26/21 1828 17     Temp 04/26/21 1828 98 F (36.7 C)     Temp src --  SpO2 04/26/21 1828 100 %     Weight --      Height --      Head Circumference --      Peak Flow --      Pain Score 04/26/21 1825 0     Pain Loc --      Pain Edu? --      Excl. in [place]? --     Constitutional: Alert and oriented. Well appearing and in no acute distress. Eyes: Conjunctivae are normal. PER. EOMI. Head: Atraumatic. Nose: No congestion/rhinnorhea. Mouth/Throat: Mucous membranes are moist.  Oropharynx non-erythematous. Neck: No stridor. Cardiovascular:  Normal rate, regular rhythm. Grossly normal heart sounds.  Good peripheral circulation. Respiratory: Normal respiratory effort.  No retractions. Lungs CTAB. Gastrointestinal: Soft and nontender. No distention. No abdominal bruits.  Musculoskeletal: No lower extremity tenderness nor edema.  Bruises described in HPI Neurologic:  Normal speech and language. No gross focal neurologic deficits are appreciated. No gait instability. Skin:  Skin is warm, dry and intact. No rash noted.   ____________________________________________   LABS (all labs ordered are listed, but only abnormal results are displayed)  Labs Reviewed  RESP PANEL BY RT-PCR (FLU A&B, COVID) ARPGX2  CBC  BASIC METABOLIC PANEL  PROTIME-INR  APTT  PATHOLOGIST SMEAR REVIEW  IMMATURE PLATELET FRACTION  TECHNOLOGIST SMEAR REVIEW  TYPE AND SCREEN   ____________________________________________  EKG   ____________________________________________  RADIOLOGY Gertha Calkin, personally viewed and evaluated these images (plain radiographs) as part of my medical decision making, as well as reviewing the written report by the radiologist.  ED MD interpretation:    Official radiology report(s): No results found.  ____________________________________________   PROCEDURES  Procedure(s) performed (including Critical Care):  Procedures   ____________________________________________   INITIAL IMPRESSION / ASSESSMENT AND PLAN / ED COURSE  Discussed with Dr. Tasia Catchings in detail.  She asked me to put her on dexamethasone 40 mg 1 dose now and she will take care of the rest of doses.  She wants a immature platelet fraction and her blood smear I have ordered these.  We will get her in the hospital.          ____________________________________________   FINAL CLINICAL IMPRESSION(S) / ED DIAGNOSES  Final diagnoses:  Thrombocytopenia ([place])  Chronic ITP (idiopathic thrombocytopenia) [facility])     ED Discharge Orders      None        Note:  This document was prepared using Dragon voice recognition software and may include unintentional dictation errors.    Nena Polio, MD 04/26/21 [phone]
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
## 2021-04-26 NOTE — Progress Notes (Signed)
Patient offers no complaints at this time

## 2021-04-27 ENCOUNTER — Encounter: Payer: Self-pay | Admitting: Internal Medicine

## 2021-04-27 DIAGNOSIS — M329 Systemic lupus erythematosus, unspecified: Secondary | ICD-10-CM | POA: Diagnosis present

## 2021-04-27 DIAGNOSIS — D682 Hereditary deficiency of other clotting factors: Secondary | ICD-10-CM

## 2021-04-27 DIAGNOSIS — E875 Hyperkalemia: Secondary | ICD-10-CM | POA: Diagnosis present

## 2021-04-27 DIAGNOSIS — Z91040 Latex allergy status: Secondary | ICD-10-CM | POA: Diagnosis not present

## 2021-04-27 DIAGNOSIS — D693 Immune thrombocytopenic purpura: Secondary | ICD-10-CM | POA: Diagnosis present

## 2021-04-27 DIAGNOSIS — Z885 Allergy status to narcotic agent status: Secondary | ICD-10-CM | POA: Diagnosis not present

## 2021-04-27 DIAGNOSIS — Z79899 Other long term (current) drug therapy: Secondary | ICD-10-CM | POA: Diagnosis not present

## 2021-04-27 DIAGNOSIS — Z20822 Contact with and (suspected) exposure to covid-19: Secondary | ICD-10-CM | POA: Diagnosis present

## 2021-04-27 DIAGNOSIS — Z888 Allergy status to other drugs, medicaments and biological substances status: Secondary | ICD-10-CM | POA: Diagnosis not present

## 2021-04-27 DIAGNOSIS — D72819 Decreased white blood cell count, unspecified: Secondary | ICD-10-CM | POA: Diagnosis present

## 2021-04-27 DIAGNOSIS — Z886 Allergy status to analgesic agent status: Secondary | ICD-10-CM | POA: Diagnosis not present

## 2021-04-27 DIAGNOSIS — D696 Thrombocytopenia, unspecified: Secondary | ICD-10-CM

## 2021-04-27 DIAGNOSIS — D6959 Other secondary thrombocytopenia: Secondary | ICD-10-CM | POA: Diagnosis present

## 2021-04-27 LAB — CBC
HCT: 39 % (ref 36.0–46.0)
Hemoglobin: 12.6 g/dL (ref 12.0–15.0)
MCH: 29.6 pg (ref 26.0–34.0)
MCHC: 32.3 g/dL (ref 30.0–36.0)
MCV: 91.8 fL (ref 80.0–100.0)
Platelets: 8 10*3/uL — CL (ref 150–400)
RBC: 4.25 MIL/uL (ref 3.87–5.11)
RDW: 13.7 % (ref 11.5–15.5)
WBC: 3.4 10*3/uL — ABNORMAL LOW (ref 4.0–10.5)
nRBC: 0 % (ref 0.0–0.2)

## 2021-04-27 LAB — RESP PANEL BY RT-PCR (FLU A&B, COVID) ARPGX2
Influenza A by PCR: NEGATIVE
Influenza B by PCR: NEGATIVE
SARS Coronavirus 2 by RT PCR: NEGATIVE

## 2021-04-27 LAB — BASIC METABOLIC PANEL
Anion gap: 4 — ABNORMAL LOW (ref 5–15)
BUN: 23 mg/dL (ref 8–23)
CO2: 26 mmol/L (ref 22–32)
Calcium: 9.1 mg/dL (ref 8.9–10.3)
Chloride: 110 mmol/L (ref 98–111)
Creatinine, Ser: 0.65 mg/dL (ref 0.44–1.00)
GFR, Estimated: >60 mL/min (ref >60)
Glucose, Bld: 147 mg/dL — ABNORMAL HIGH (ref 70–99)
Potassium: 5.6 mmol/L — ABNORMAL HIGH (ref 3.5–5.1)
Sodium: 140 mmol/L (ref 135–145)

## 2021-04-27 LAB — GLUCOSE, CAPILLARY: Glucose-Capillary: 126 mg/dL — ABNORMAL HIGH (ref 70–99)

## 2021-04-27 LAB — HIV ANTIBODY (ROUTINE TESTING W REFLEX): HIV Screen 4th Generation wRfx: NONREACTIVE

## 2021-04-27 MED ORDER — DEXAMETHASONE SODIUM PHOSPHATE 10 MG/ML IJ SOLN
40.0000 mg | Freq: Every day | INTRAMUSCULAR | Status: DC
  Filled 2021-04-27: qty 4

## 2021-04-27 MED ORDER — IMMUNE GLOBULIN (HUMAN) 10 GM/100ML IV SOLN
1.0000 g/kg | INTRAVENOUS | Status: DC
  Administered 2021-04-27 – 2021-04-28 (×2): 65 g via INTRAVENOUS
  Filled 2021-04-27 (×2): qty 600
  Filled 2021-04-27: qty 650

## 2021-04-27 MED ORDER — DIPHENHYDRAMINE HCL 25 MG PO CAPS
50.0000 mg | ORAL_CAPSULE | Freq: Once | ORAL | Status: AC
  Administered 2021-04-27: 50 mg via ORAL
  Filled 2021-04-27: qty 2

## 2021-04-27 MED ORDER — DEXAMETHASONE 4 MG PO TABS
40.0000 mg | ORAL_TABLET | Freq: Every day | ORAL | Status: DC
  Administered 2021-04-27 – 2021-04-28 (×2): 40 mg via ORAL
  Filled 2021-04-27 (×3): qty 10

## 2021-04-27 NOTE — Plan of Care (Signed)
  Problem: Education: Goal: Knowledge of General Education information will improve Description: Including pain rating scale, medication(s)/side effects and non-pharmacologic comfort measures Outcome: Progressing   Problem: Clinical Measurements: Goal: Respiratory complications will improve Outcome: Progressing   Problem: Activity: Goal: Risk for activity intolerance will decrease Outcome: Progressing   Problem: Coping: Goal: Level of anxiety will decrease Outcome: Progressing   Problem: Pain Managment: Goal: General experience of comfort will improve Outcome: Progressing   Problem: Safety: Goal: Ability to remain free from injury will improve Outcome: Progressing   Problem: Skin Integrity: Goal: Risk for impaired skin integrity will decrease Outcome: Progressing   Problem: Clinical Measurements: Goal: Diagnostic test results will improve Outcome: Not Progressing

## 2021-04-27 NOTE — Progress Notes (Signed)
Sister to pt Dana [name]) will take pt's cash $116 and earrings home

## 2021-04-27 NOTE — Progress Notes (Signed)
Patient ID: Dana [name], female   DOB: 1961-02-20, 60 y.o.   MRN: [id number]  PROGRESS NOTE    Dana [name]  [id number] DOB: 1960-09-29 DOA: 04/26/2021 PCP: Blima Dessert, MD   Brief Narrative:  60 year old female with history of lupus, factor XII deficiency chronic ITP presented with extremely low platelets as per oncology recommendations.  Platelets were 9; repeat platelets were 11.    Assessment & Plan:   Severe thrombocytopenia in a patient with history of chronic ITP -Platelets 9 and subsequently 11 on presentation.  Platelets 8 today. -Patient being started on Decadron and IVIG.  Follow oncology recommendations.  Monitor platelets.  Factor XII deficiency -Follow oncology recommendations  Lupus -Continue Plaquenil  Leukopenia -Mild.  Monitor  Hyperkalemia -Possibly spurious.  Repeat a.m. labs.  DVT prophylaxis: SCDs Code Status: Full Family Communication: None at bedside Disposition Plan: Status is: Observation  The patient will require care spanning > 2 midnights and should be moved to inpatient because: Inpatient level of care appropriate due to severity of illness  Dispo: The patient is from: Home              Anticipated d/c is to: Home              Patient currently is not medically stable to d/c.   Difficult to place patient No   Consultants: Oncology evaluation pending  Procedures: None  Antimicrobials: None   Subjective: Patient seen and examined at bedside.  Denies any current nausea, vomiting, headache or bleeding from orifices.  Objective: Vitals:   04/27/21 0400 04/27/21 0527 04/27/21 0615 04/27/21 0902  BP: (!) 143/70 (!) 154/77 (!) 154/77 (!) 177/75  Pulse:  68 (!) 101 66  Resp: '19 17 17 16  '$ Temp:  98 F (36.7 C) 98 F (36.7 C) 97.6 F (36.4 C)  TempSrc:   Oral   SpO2:  98% 98% 100%  Weight:   67 kg   Height:   5' 7.5" (1.715 m)    No intake or output data in the 24 hours ending 04/27/21 1110 Filed Weights   04/27/21  0615  Weight: 67 kg    Examination:  General exam: Appears calm and comfortable.  Currently on room air Respiratory system: Bilateral decreased breath sounds at bases Cardiovascular system: S1 & S2 heard, Rate controlled Gastrointestinal system: Abdomen is nondistended, soft and nontender. Normal bowel sounds heard. Extremities: No cyanosis, clubbing, edema  Central nervous system: Alert and oriented. No focal neurological deficits. Moving extremities Skin: Bruising around right posterior arm present. Psychiatry: Judgement and insight appear normal. Mood & affect appropriate.     Data Reviewed: I have personally reviewed following labs and imaging studies  CBC: Recent Labs  Lab 04/26/21 1423 04/26/21 1827 04/27/21 0554  WBC 3.9* 4.5 3.4*  NEUTROABS 2.5  --   --   HGB 12.1 12.8 12.6  HCT 37.2 38.6 39.0  MCV 91.6 91.5 91.8  PLT 9* 11* 8*   Basic Metabolic Panel: Recent Labs  Lab 04/26/21 1423 04/26/21 1827 04/27/21 0554  NA 134* 139 140  K 4.6 4.2 5.6*  CL 102 106 110  CO2 '26 28 26  '$ GLUCOSE 119* 114* 147*  BUN 24* 23 23  CREATININE 0.96 0.88 0.65  CALCIUM 8.7* 9.0 9.1   GFR: Estimated Creatinine Clearance: 72.3 mL/min (by C-G formula based on SCr of 0.65 mg/dL). Liver Function Tests: Recent Labs  Lab 04/26/21 1423  AST 22  ALT 29  ALKPHOS 75  BILITOT  0.9  PROT 7.3  ALBUMIN 4.2   No results for input(s): LIPASE, AMYLASE in the last 168 hours. No results for input(s): AMMONIA in the last 168 hours. Coagulation Profile: Recent Labs  Lab 04/26/21 2110  INR 1.1   Cardiac Enzymes: No results for input(s): CKTOTAL, CKMB, CKMBINDEX, TROPONINI in the last 168 hours. BNP (last 3 results) No results for input(s): PROBNP in the last 8760 hours. HbA1C: No results for input(s): HGBA1C in the last 72 hours. CBG: Recent Labs  Lab 04/26/21 2311 04/27/21 0904  GLUCAP 161* 126*   Lipid Profile: No results for input(s): CHOL, HDL, LDLCALC, TRIG, CHOLHDL,  LDLDIRECT in the last 72 hours. Thyroid Function Tests: No results for input(s): TSH, T4TOTAL, FREET4, T3FREE, THYROIDAB in the last 72 hours. Anemia Panel: No results for input(s): VITAMINB12, FOLATE, FERRITIN, TIBC, IRON, RETICCTPCT in the last 72 hours. Sepsis Labs: No results for input(s): PROCALCITON, LATICACIDVEN in the last 168 hours.  Recent Results (from the past 240 hour(s))  Resp Panel by RT-PCR (Flu A&B, Covid) Nasopharyngeal Swab     Status: None   Collection Time: 04/27/21  3:20 AM   Specimen: Nasopharyngeal Swab; Nasopharyngeal(NP) swabs in vial transport medium  Result Value Ref Range Status   SARS Coronavirus 2 by RT PCR NEGATIVE NEGATIVE Final    Comment: (NOTE) SARS-CoV-2 target nucleic acids are NOT DETECTED.  The SARS-CoV-2 RNA is generally detectable in upper respiratory specimens during the acute phase of infection. The lowest concentration of SARS-CoV-2 viral copies this assay can detect is 138 copies/mL. A negative result does not preclude SARS-Cov-2 infection and should not be used as the sole basis for treatment or other patient management decisions. A negative result may occur with  improper specimen collection/handling, submission of specimen other than nasopharyngeal swab, presence of viral mutation(s) within the areas targeted by this assay, and inadequate number of viral copies(<138 copies/mL). A negative result must be combined with clinical observations, patient history, and epidemiological information. The expected result is Negative.  Fact Sheet for Patients:  EntrepreneurPulse.com.au  Fact Sheet for Healthcare Providers:  IncredibleEmployment.be  This test is no t yet approved or cleared by the [place] FDA and  has been authorized for detection and/or diagnosis of SARS-CoV-2 by FDA under an Emergency Use Authorization (EUA). This EUA will remain  in effect (meaning this test can be used) for the  duration of the COVID-19 declaration under Section 564(b)(1) of the Act, 21 U.S.C.section 360bbb-3(b)(1), unless the authorization is terminated  or revoked sooner.       Influenza A by PCR NEGATIVE NEGATIVE Final   Influenza B by PCR NEGATIVE NEGATIVE Final    Comment: (NOTE) The Xpert Xpress SARS-CoV-2/FLU/RSV plus assay is intended as an aid in the diagnosis of influenza from Nasopharyngeal swab specimens and should not be used as a sole basis for treatment. Nasal washings and aspirates are unacceptable for Xpert Xpress SARS-CoV-2/FLU/RSV testing.  Fact Sheet for Patients: EntrepreneurPulse.com.au  Fact Sheet for Healthcare Providers: IncredibleEmployment.be  This test is not yet approved or cleared by the [place] FDA and has been authorized for detection and/or diagnosis of SARS-CoV-2 by FDA under an Emergency Use Authorization (EUA). This EUA will remain in effect (meaning this test can be used) for the duration of the COVID-19 declaration under Section 564(b)(1) of the Act, 21 U.S.C. section 360bbb-3(b)(1), unless the authorization is terminated or revoked.  Performed at [facility], [address] [place], [place] [address]  Radiology Studies: No results found.      Scheduled Meds:  dexamethasone  40 mg Oral Daily   diphenhydrAMINE  50 mg Oral Once   hydroxychloroquine  200 mg Oral Daily   multivitamin with minerals  1 tablet Oral Daily   Continuous Infusions:  IMMUNE GLOBULIN 10% (HUMAN) IV - For Fluid Restriction Only            Aline August, MD [facility] 04/27/2021, 11:10 AM

## 2021-04-27 NOTE — Plan of Care (Signed)
  Problem: Education: Goal: Knowledge of General Education information will improve Description: Including pain rating scale, medication(s)/side effects and non-pharmacologic comfort measures Outcome: Progressing   Problem: Activity: Goal: Risk for activity intolerance will decrease Outcome: Progressing   Problem: Coping: Goal: Level of anxiety will decrease Outcome: Progressing   

## 2021-04-27 NOTE — Consult Note (Addendum)
Hematology/Oncology Consult note [facility] Telephone:([phone] [phone]  Patient Care Team: Blima Dessert, MD as PCP - General (Rheumatology)   Name of the patient: Dana [name]  [id number]  [birth year]   Date of visit: 04/27/21 REASON FOR COSULTATION:  Thrombocytopenia History of presenting illness-  60 y.o. female with PMH listed at below who presents to ER for evaluation of thrombocytopenia .  Patient is known to hematology oncology service for chronic ITP. Patient has been treated before with prednisone/IVIG.  She also underwent weekly rituximab x4 in 2017. Patient has a history of factor XII deficiency and has chronically elevated PTT.  04/26/2021, patient presented to hematology oncology office for follow-up visit.  She previously followed up with Dr. Mike Gip.  Patient was seen by Beckey Rutter.  Patient was found to have acute drop of platelet to 9000 comparing to her chronic baseline and was advised to go to emergency room for further evaluation and treatments.  I was called by ER physician and recommend patient to be started on dexamethasone 40 mg x 1.  Check smear, immature platelet fraction.  Patient was seen by me today.  She does not have any acute bleeding events, petechia or easy bruising.  She reports having burning sensation after received dexamethasone IV.  No acute overnight event. Platelet count today was 8000. Patient has lupus and is on Plaquenil 200 mg daily.   Review of Systems  Constitutional:  Negative for appetite change, chills, fatigue and fever.  HENT:   Negative for hearing loss and voice change.   Eyes:  Negative for eye problems.  Respiratory:  Negative for chest tightness and cough.   Cardiovascular:  Negative for chest pain.  Gastrointestinal:  Negative for abdominal distention, abdominal pain and blood in stool.  Endocrine: Negative for hot flashes.  Genitourinary:  Negative for difficulty urinating and  frequency.   Musculoskeletal:  Negative for arthralgias.  Skin:  Negative for itching and rash.  Neurological:  Negative for extremity weakness.  Hematological:  Negative for adenopathy.  Psychiatric/Behavioral:  Negative for confusion.    Allergies  Allergen Reactions   Aspirin     Stomach issue   Ibuprofen    Latex Swelling    Patient said she can use latex gloves now.   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[place]) 12/05/2015   Idiopathic thrombocytopenic purpura (ITP) ([place]) 07/27/2015   Leg pain 07/26/2015   Idiopathic thrombocytopenic purpura ([place]) 04/10/2015   Thrombocytopenia ([place]) 04/07/2015   Fever 04/07/2015   Encounter for gynecological examination without abnormal finding 09/14/2012   Villonodular synovitis (pigmented), 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   Disseminated lupus erythematosus ([place]) 02/10/2011   Systemic lupus erythematosus ([place]) 02/10/2011     Past Medical History:  Diagnosis Date   Eye exam normal 02/04/2017   Hemorrhoid    Hx of mammogram    ITP (idiopathic thrombocytopenic purpura)    Lupus ([place])    Pap smear abnormality of cervix 12/03/2016   Positive colorectal cancer screening using Cologuard test 02/08/2017   Uterine fibroid      Past Surgical History:  Procedure Laterality Date  CARPAL TUNNEL RELEASE     bilateral   PELVIC LAPAROSCOPY      Social History   Socioeconomic History   Marital status: Single    Spouse name: Not on file   Number of children: Not on file   Years of education: Not on file   Highest education level: Not on file   Occupational History   Not on file  Tobacco Use   Smoking status: Never   Smokeless tobacco: Never  Vaping Use   Vaping Use: Not on file  Substance and Sexual Activity   Alcohol use: No   Drug use: No   Sexual activity: Not on file  Other Topics Concern   Not on file  Social History Narrative   Not on file   Social Determinants of Health   Financial Resource Strain: Not on file  Food Insecurity: Not on file  Transportation Needs: Not on file  Physical Activity: Not on file  Stress: Not on file  Social Connections: Not on file  Intimate Partner Violence: Not on file     Family History  Problem Relation Age of Onset   Hypertension Mother    Cancer Father        porstate   Diabetes Father    Hypertension Sister      Current Facility-Administered Medications:    acetaminophen (TYLENOL) tablet 650 mg, 650 mg, Oral, Q6H PRN **OR** acetaminophen (TYLENOL) suppository 650 mg, 650 mg, Rectal, Q6H PRN, Cox, Amy N, DO   dexamethasone (DECADRON) tablet 40 mg, 40 mg, Oral, Daily, Earlie Server, MD   diphenhydrAMINE (BENADRYL) capsule 25 mg, 25 mg, Oral, Q8H PRN, Cox, Amy N, DO   diphenhydrAMINE (BENADRYL) capsule 50 mg, 50 mg, Oral, Once, Earlie Server, MD   hydrALAZINE (APRESOLINE) tablet 10 mg, 10 mg, Oral, Q8H PRN, Cox, Amy N, DO   hydroxychloroquine (PLAQUENIL) tablet 200 mg, 200 mg, Oral, Daily, Cox, Amy N, DO   Immune Globulin 10% (PRIVIGEN) IV infusion 65 g, 1 g/kg, Intravenous, Q24H, Earlie Server, MD   multivitamin with minerals tablet 1 tablet, 1 tablet, Oral, Daily, Cox, Amy N, DO, 1 tablet at 04/27/21 0931   ondansetron (ZOFRAN) tablet 4 mg, 4 mg, Oral, Q6H PRN **OR** ondansetron (ZOFRAN) injection 4 mg, 4 mg, Intravenous, Q6H PRN, Cox, Amy N, DO   Physical exam:  Vitals:   04/27/21 0527 04/27/21 0615 04/27/21 0902 04/27/21 1235  BP: (!) 154/77 (!) 154/77 (!) 177/75 (!) 148/83  Pulse: 68 (!) 101 66 63  Resp: '17 17 16 16  '$ Temp: 98 F (36.7 C) 98 F (36.7 C) 97.6 F (36.4 C)  98 F (36.7 C)  TempSrc:  Oral    SpO2: 98% 98% 100% 99%  Weight:  147 lb 11.3 oz (67 kg)    Height:  5' 7.5" (1.715 m)     Physical Exam Constitutional:      General: She is not in acute distress.    Appearance: She is not diaphoretic.  HENT:     Head: Normocephalic and atraumatic.     Nose: Nose normal.     Mouth/Throat:     Pharynx: No oropharyngeal exudate.  Eyes:     General: No scleral icterus.    Pupils: Pupils are equal, round, and reactive to light.  Cardiovascular:     Rate and Rhythm: Normal rate and regular rhythm.     Heart sounds: No murmur heard. Pulmonary:     Effort: Pulmonary effort is normal. No respiratory distress.  Breath sounds: No rales.  Chest:     Chest wall: No tenderness.  Abdominal:     General: There is no distension.     Palpations: Abdomen is soft.     Tenderness: There is no abdominal tenderness.  Musculoskeletal:        General: Normal range of motion.     Cervical back: Normal range of motion and neck supple.  Skin:    General: Skin is warm and dry.     Findings: No erythema.  Neurological:     Mental Status: She is alert and oriented to person, place, and time.     Cranial Nerves: No cranial nerve deficit.     Motor: No abnormal muscle tone.     Coordination: Coordination normal.  Psychiatric:        Mood and Affect: Affect normal.     Labs CBC    Component Value Date/Time   WBC 3.4 (L) 04/27/2021 0554   RBC 4.25 04/27/2021 0554   HGB 12.6 04/27/2021 0554   HCT 39.0 04/27/2021 0554   PLT 8 (LL) 04/27/2021 0554   MCV 91.8 04/27/2021 0554   MCH 29.6 04/27/2021 0554   MCHC 32.3 04/27/2021 0554   RDW 13.7 04/27/2021 0554   LYMPHSABS 0.9 04/26/2021 1423   MONOABS 0.4 04/26/2021 1423   EOSABS 0.1 04/26/2021 1423   BASOSABS 0.0 04/26/2021 1423   CMP Latest Ref Rng & Units 04/27/2021 04/26/2021 04/26/2021  Glucose 70 - 99 mg/dL 147(H) 114(H) 119(H)  BUN 8 - 23 mg/dL 23 23 24(H)  Creatinine 0.44 - 1.00 mg/dL 0.65 0.88 0.96   Sodium 135 - 145 mmol/L 140 139 134(L)  Potassium 3.5 - 5.1 mmol/L 5.6(H) 4.2 4.6  Chloride 98 - 111 mmol/L 110 106 102  CO2 22 - 32 mmol/L '26 28 26  '$ Calcium 8.9 - 10.3 mg/dL 9.1 9.0 8.7(L)  Total Protein 6.5 - 8.1 g/dL - - 7.3  Total Bilirubin 0.3 - 1.2 mg/dL - - 0.9  Alkaline Phos 38 - 126 U/L - - 75  AST 15 - 41 U/L - - 22  ALT 0 - 44 U/L - - 29      RADIOGRAPHIC STUDIES: I have personally reviewed the radiological images as listed and agreed with the findings in the report. No results found.  Assessment and plan-   #Acute on chronic thrombocytopenia, Immature platelet fraction is elevated.  Normal fibrinogen, normal LDH. Smear shows normal morphology.  Compatible with recurrent acute ITP. Recommend patient to finish dexamethasone 40 mg daily x4. She had burning sensation after receiving IV dexamethasone.  We will switch to dexamethasone 40 mg today. Discussed with patient that she is at risk of bleeding.  Transfusion of platelet is most likely not going to improve her platelet count. She previously has required additional treatments to keep platelet stable.  Discussed about the rationale and potential side effects of IVIG and she agrees with the treatment. Proceed with IVIG 1 mg/kilogram daily x2.-Patient will receive Benadryl prior to the IVIG. Monitor CBC daily. If platelet further decrease tomorrow, I recommend to proceed with 1 unit of platelet transfusion to minimize her bleeding risk. Check vitamin [id number], folic acid, flow cytometry tomorrow.  #Chronically elevated PTT due to Factor XII deficiency.  No intervention needed.  No increased bleeding risk due to factor XII deficiency. Thank you for allowing me to participate in the care of this patient.     Earlie Server, MD, PhD Hematology Oncology [facility] at  [facility]  04/27/2021

## 2021-04-28 LAB — CBC WITH DIFFERENTIAL/PLATELET
Abs Immature Granulocytes: 0.04 10*3/uL (ref 0.00–0.07)
Basophils Absolute: 0 10*3/uL (ref 0.0–0.1)
Basophils Relative: 0 %
Eosinophils Absolute: 0 10*3/uL (ref 0.0–0.5)
Eosinophils Relative: 0 %
HCT: 35.3 % — ABNORMAL LOW (ref 36.0–46.0)
Hemoglobin: 11.7 g/dL — ABNORMAL LOW (ref 12.0–15.0)
Immature Granulocytes: 1 %
Lymphocytes Relative: 9 %
Lymphs Abs: 0.7 10*3/uL (ref 0.7–4.0)
MCH: 30.4 pg (ref 26.0–34.0)
MCHC: 33.1 g/dL (ref 30.0–36.0)
MCV: 91.7 fL (ref 80.0–100.0)
Monocytes Absolute: 0.1 10*3/uL (ref 0.1–1.0)
Monocytes Relative: 2 %
Neutro Abs: 6.5 10*3/uL (ref 1.7–7.7)
Neutrophils Relative %: 88 %
Platelets: 43 10*3/uL — ABNORMAL LOW (ref 150–400)
RBC: 3.85 MIL/uL — ABNORMAL LOW (ref 3.87–5.11)
RDW: 13.8 % (ref 11.5–15.5)
WBC: 7.4 10*3/uL (ref 4.0–10.5)
nRBC: 0 % (ref 0.0–0.2)

## 2021-04-28 LAB — HEMOGLOBIN A1C
Hgb A1c MFr Bld: 5 % (ref 4.8–5.6)
Mean Plasma Glucose: 96.8 mg/dL

## 2021-04-28 LAB — COMPREHENSIVE METABOLIC PANEL
ALT: 22 U/L (ref 0–44)
AST: 18 U/L (ref 15–41)
Albumin: 3.7 g/dL (ref 3.5–5.0)
Alkaline Phosphatase: 62 U/L (ref 38–126)
Anion gap: 4 — ABNORMAL LOW (ref 5–15)
BUN: 24 mg/dL — ABNORMAL HIGH (ref 8–23)
CO2: 24 mmol/L (ref 22–32)
Calcium: 8.8 mg/dL — ABNORMAL LOW (ref 8.9–10.3)
Chloride: 110 mmol/L (ref 98–111)
Creatinine, Ser: 0.64 mg/dL (ref 0.44–1.00)
GFR, Estimated: >60 mL/min (ref >60)
Glucose, Bld: 134 mg/dL — ABNORMAL HIGH (ref 70–99)
Potassium: 4.1 mmol/L (ref 3.5–5.1)
Sodium: 138 mmol/L (ref 135–145)
Total Bilirubin: 0.9 mg/dL (ref 0.3–1.2)
Total Protein: 9.1 g/dL — ABNORMAL HIGH (ref 6.5–8.1)

## 2021-04-28 LAB — MAGNESIUM: Magnesium: 2.1 mg/dL (ref 1.7–2.4)

## 2021-04-28 LAB — FOLATE: Folate: 26 ng/mL (ref >5.9)

## 2021-04-28 LAB — VITAMIN B12: Vitamin B-12: 419 pg/mL (ref 180–914)

## 2021-04-28 MED ORDER — DIPHENHYDRAMINE HCL 25 MG PO CAPS
50.0000 mg | ORAL_CAPSULE | Freq: Once | ORAL | Status: AC
  Administered 2021-04-28: 13:00:00 50 mg via ORAL
  Filled 2021-04-28: qty 2

## 2021-04-28 MED ORDER — DEXAMETHASONE 20 MG PO TABS: 40.0000 mg | ORAL_TABLET | Freq: Every day | ORAL | 0 refills | Status: DC

## 2021-04-28 NOTE — Discharge Summary (Signed)
Physician Discharge Summary  Dana [name] [id number] DOB: [birth year] DOA: 04/26/2021  PCP: Blima Dessert, MD  Admit date: 04/26/2021 Discharge date: 04/28/2021  Admitted From: Home Disposition: Home  Recommendations for Outpatient Follow-up:  Follow up with PCP in 1 week with repeat CBC/BMP Outpatient follow-up with oncology at earliest convenience Follow up in ED if symptoms worsen or new appear   Home Health: No Equipment/Devices: None  Discharge Condition: Stable CODE STATUS: Full Diet recommendation: Regular  Brief/Interim Summary: 60 year old female with history of lupus, factor XII deficiency chronic ITP presented with extremely low platelets as per oncology recommendations.  Platelets were 9; repeat platelets were 11.  Platelets 43 today.  Oncology has cleared the patient for discharge home today.  Discharge home today.  Discharge Diagnoses:   Severe thrombocytopenia in a patient with history of chronic ITP -Platelets 9 and subsequently 11 on presentation.   -Platelets 43 today. -Patient has been started on Decadron and IVIG.  Patient will get 1 more dose of IVIG and oral Decadron today.  Oncology has cleared the patient for discharge home today afterwards.  She will need to take another dose of oral Decadron 40 mg on 04/29/2021.  Outpatient follow-up with oncology at earliest convenience.  Discharge patient home today.  factor XII deficiency -Follow-up with oncology as an outpatient.  Lupus -Continue Plaquenil   Leukopenia -Resolved  Hyperkalemia -Possibly spurious.  Resolved.   Discharge Instructions  Discharge Instructions     Diet general   Complete by: As directed    Increase activity slowly   Complete by: As directed       Allergies as of 04/28/2021       Reactions   Aspirin    Stomach issue   Ibuprofen    Latex Swelling   Patient said she can use latex gloves now.   Morphine And Related Nausea Only   Protonix [pantoprazole Sodium]  Other (See Comments)   Cramping in lower legs   Buprenorphine Hcl Nausea Only        Medication List     TAKE these medications    acetaminophen 500 MG tablet Commonly known as: TYLENOL Take 500 mg by mouth every 6 (six) hours as needed for mild pain, moderate pain, fever or headache.   Dexamethasone 20 MG Tabs Take 40 mg by mouth daily. Take 40 mg on 04/29/2021 then stop as per Dr. Berniece Pap   hydroxychloroquine 200 MG tablet Commonly known as: PLAQUENIL Take 1 tablet by mouth daily.   multivitamin tablet Take 1 tablet by mouth daily.        Follow-up Information     Blima Dessert, MD. Schedule an appointment as soon as possible for a visit in 1 week(s).   Specialty: Rheumatology Contact information: [place] [address] [phone]         Earlie Server, MD Follow up.   Specialty: Oncology Why: at earliest convenience Contact information: [place] [address] [phone]                Allergies  Allergen Reactions   Aspirin     Stomach issue   Ibuprofen    Latex Swelling    Patient said she can use latex gloves now.   Morphine And Related Nausea Only   Protonix [Pantoprazole Sodium] Other (See Comments)    Cramping in lower legs    Buprenorphine Hcl Nausea Only    Consultations: Oncology   Procedures/Studies: No results found.  Subjective: Patient seen and examined aggressive.  Denies bleeding, nausea, vomiting, headache or fever.  Discharge Exam: Vitals:   04/28/21 0428 04/28/21 0747  BP: 118/75 (!) 147/75  Pulse: 60 61  Resp: 15 16  Temp: 97.7 F (36.5 C) 98.6 F (37 C)  SpO2: 99% 100%    General: Pt is alert, awake, not in acute distress Cardiovascular: rate controlled, S1/S2 + Respiratory: bilateral decreased breath sounds at bases Abdominal: Soft, NT, ND, bowel sounds + Extremities: no edema, no cyanosis    The results of significant diagnostics from this  hospitalization (including imaging, microbiology, ancillary and laboratory) are listed below for reference.     Microbiology: Recent Results (from the past 240 hour(s))  Resp Panel by RT-PCR (Flu A&B, Covid) Nasopharyngeal Swab     Status: None   Collection Time: 04/27/21  3:20 AM   Specimen: Nasopharyngeal Swab; Nasopharyngeal(NP) swabs in vial transport medium  Result Value Ref Range Status   SARS Coronavirus 2 by RT PCR NEGATIVE NEGATIVE Final    Comment: (NOTE) SARS-CoV-2 target nucleic acids are NOT DETECTED.  The SARS-CoV-2 RNA is generally detectable in upper respiratory specimens during the acute phase of infection. The lowest concentration of SARS-CoV-2 viral copies this assay can detect is 138 copies/mL. A negative result does not preclude SARS-Cov-2 infection and should not be used as the sole basis for treatment or other patient management decisions. A negative result may occur with  improper specimen collection/handling, submission of specimen other than nasopharyngeal swab, presence of viral mutation(s) within the areas targeted by this assay, and inadequate number of viral copies(<138 copies/mL). A negative result must be combined with clinical observations, patient history, and epidemiological information. The expected result is Negative.  Fact Sheet for Patients:  EntrepreneurPulse.com.au  Fact Sheet for Healthcare Providers:  IncredibleEmployment.be  This test is no t yet approved or cleared by the [place] FDA and  has been authorized for detection and/or diagnosis of SARS-CoV-2 by FDA under an Emergency Use Authorization (EUA). This EUA will remain  in effect (meaning this test can be used) for the duration of the COVID-19 declaration under Section 564(b)(1) of the Act, 21 U.S.C.section 360bbb-3(b)(1), unless the authorization is terminated  or revoked sooner.       Influenza A by PCR NEGATIVE NEGATIVE Final    Influenza B by PCR NEGATIVE NEGATIVE Final    Comment: (NOTE) The Xpert Xpress SARS-CoV-2/FLU/RSV plus assay is intended as an aid in the diagnosis of influenza from Nasopharyngeal swab specimens and should not be used as a sole basis for treatment. Nasal washings and aspirates are unacceptable for Xpert Xpress SARS-CoV-2/FLU/RSV testing.  Fact Sheet for Patients: EntrepreneurPulse.com.au  Fact Sheet for Healthcare Providers: IncredibleEmployment.be  This test is not yet approved or cleared by the [place] FDA and has been authorized for detection and/or diagnosis of SARS-CoV-2 by FDA under an Emergency Use Authorization (EUA). This EUA will remain in effect (meaning this test can be used) for the duration of the COVID-19 declaration under Section 564(b)(1) of the Act, 21 U.S.C. section 360bbb-3(b)(1), unless the authorization is terminated or revoked.  Performed at [facility], [address] [place], [place] [address]      Labs: BNP (last 3 results) No results for input(s): BNP in the last 8760 hours. Basic Metabolic Panel: Recent Labs  Lab 04/26/21 1423 04/26/21 1827 04/27/21 0554 04/28/21 0517  NA 134* 139 140 138  K 4.6 4.2 5.6* 4.1  CL 102 106 110 110  CO2 '26 28 26 24  '$ GLUCOSE 119* 114* 147* 134*  BUN 24* 23 23 24*  CREATININE 0.96 0.88 0.65 0.64  CALCIUM 8.7* 9.0 9.1 8.8*  MG  --   --   --  2.1   Liver Function Tests: Recent Labs  Lab 04/26/21 1423 04/28/21 0517  AST 22 18  ALT 29 22  ALKPHOS 75 62  BILITOT 0.9 0.9  PROT 7.3 9.1*  ALBUMIN 4.2 3.7   No results for input(s): LIPASE, AMYLASE in the last 168 hours. No results for input(s): AMMONIA in the last 168 hours. CBC: Recent Labs  Lab 04/26/21 1423 04/26/21 1827 04/27/21 0554 04/28/21 0517  WBC 3.9* 4.5 3.4* 7.4  NEUTROABS 2.5  --   --  6.5  HGB 12.1 12.8 12.6 11.7*  HCT 37.2 38.6 39.0 35.3*  MCV 91.6 91.5 91.8 91.7  PLT 9* 11* 8*  43*   Cardiac Enzymes: No results for input(s): CKTOTAL, CKMB, CKMBINDEX, TROPONINI in the last 168 hours. BNP: Invalid input(s): POCBNP CBG: Recent Labs  Lab 04/26/21 2311 04/27/21 0904  GLUCAP 161* 126*   D-Dimer No results for input(s): DDIMER in the last 72 hours. Hgb A1c Recent Labs    04/28/21 0517  HGBA1C 5.0   Lipid Profile No results for input(s): CHOL, HDL, LDLCALC, TRIG, CHOLHDL, LDLDIRECT in the last 72 hours. Thyroid function studies No results for input(s): TSH, T4TOTAL, T3FREE, THYROIDAB in the last 72 hours.  Invalid input(s): FREET3 Anemia work up Recent Labs    04/28/21 0517  FOLATE 26.0   Urinalysis    Component Value Date/Time   COLORURINE YELLOW (A) 04/07/2015 2020   APPEARANCEUR CLEAR (A) 04/07/2015 2020   LABSPEC 1.009 04/07/2015 2020   PHURINE 7.0 04/07/2015 2020   GLUCOSEU NEGATIVE 04/07/2015 2020   HGBUR NEGATIVE 04/07/2015 2020   [place] NEGATIVE 04/07/2015 2020   KETONESUR NEGATIVE 04/07/2015 2020   PROTEINUR NEGATIVE 04/07/2015 2020   NITRITE NEGATIVE 04/07/2015 2020   LEUKOCYTESUR NEGATIVE 04/07/2015 2020   Sepsis Labs Invalid input(s): PROCALCITONIN,  WBC,  LACTICIDVEN Microbiology Recent Results (from the past 240 hour(s))  Resp Panel by RT-PCR (Flu A&B, Covid) Nasopharyngeal Swab     Status: None   Collection Time: 04/27/21  3:20 AM   Specimen: Nasopharyngeal Swab; Nasopharyngeal(NP) swabs in vial transport medium  Result Value Ref Range Status   SARS Coronavirus 2 by RT PCR NEGATIVE NEGATIVE Final    Comment: (NOTE) SARS-CoV-2 target nucleic acids are NOT DETECTED.  The SARS-CoV-2 RNA is generally detectable in upper respiratory specimens during the acute phase of infection. The lowest concentration of SARS-CoV-2 viral copies this assay can detect is 138 copies/mL. A negative result does not preclude SARS-Cov-2 infection and should not be used as the sole basis for treatment or other patient management decisions. A  negative result may occur with  improper specimen collection/handling, submission of specimen other than nasopharyngeal swab, presence of viral mutation(s) within the areas targeted by this assay, and inadequate number of viral copies(<138 copies/mL). A negative result must be combined with clinical observations, patient history, and epidemiological information. The expected result is Negative.  Fact Sheet for Patients:  EntrepreneurPulse.com.au  Fact Sheet for Healthcare Providers:  IncredibleEmployment.be  This test is no t yet approved or cleared by the [place] FDA and  has been authorized for detection and/or diagnosis of SARS-CoV-2 by FDA under an Emergency Use Authorization (EUA). This EUA will remain  in effect (meaning this test can be used) for the  duration of the COVID-19 declaration under Section 564(b)(1) of the Act, 21 U.S.C.section 360bbb-3(b)(1), unless the authorization is terminated  or revoked sooner.       Influenza A by PCR NEGATIVE NEGATIVE Final   Influenza B by PCR NEGATIVE NEGATIVE Final    Comment: (NOTE) The Xpert Xpress SARS-CoV-2/FLU/RSV plus assay is intended as an aid in the diagnosis of influenza from Nasopharyngeal swab specimens and should not be used as a sole basis for treatment. Nasal washings and aspirates are unacceptable for Xpert Xpress SARS-CoV-2/FLU/RSV testing.  Fact Sheet for Patients: EntrepreneurPulse.com.au  Fact Sheet for Healthcare Providers: IncredibleEmployment.be  This test is not yet approved or cleared by the [place] FDA and has been authorized for detection and/or diagnosis of SARS-CoV-2 by FDA under an Emergency Use Authorization (EUA). This EUA will remain in effect (meaning this test can be used) for the duration of the COVID-19 declaration under Section 564(b)(1) of the Act, 21 U.S.C. section 360bbb-3(b)(1), unless the authorization  is terminated or revoked.  Performed at [facility], [address] [place],  [address]      Time coordinating discharge: 35 minutes  SIGNED:   Aline August, MD  [facility] 04/28/2021, 10:25 AM

## 2021-04-29 LAB — PATHOLOGIST SMEAR REVIEW

## 2021-04-30 LAB — TYPE AND SCREEN
ABO/RH(D): O POS
Antibody Screen: POSITIVE
DAT, IgG: POSITIVE
DAT, complement: NEGATIVE
Unit division: 0
Unit division: 0

## 2021-04-30 LAB — COMP PANEL: LEUKEMIA/LYMPHOMA

## 2021-04-30 LAB — BPAM RBC
Blood Product Expiration Date: 202209182359
Blood Product Expiration Date: 202209192359
Unit Type and Rh: 9500
Unit Type and Rh: 9500

## 2021-05-02 ENCOUNTER — Other Ambulatory Visit: Payer: Self-pay | Admitting: *Deleted

## 2021-05-02 ENCOUNTER — Encounter: Payer: Self-pay | Admitting: Internal Medicine

## 2021-05-02 ENCOUNTER — Inpatient Hospital Stay: Payer: 59

## 2021-05-02 ENCOUNTER — Inpatient Hospital Stay (HOSPITAL_BASED_OUTPATIENT_CLINIC_OR_DEPARTMENT_OTHER): Payer: 59 | Admitting: Internal Medicine

## 2021-05-02 VITALS — BP 140/72 | HR 72 | Temp 97.8°F | Resp 16 | Wt 144.0 lb

## 2021-05-02 DIAGNOSIS — D693 Immune thrombocytopenic purpura: Secondary | ICD-10-CM | POA: Insufficient documentation

## 2021-05-02 DIAGNOSIS — M329 Systemic lupus erythematosus, unspecified: Secondary | ICD-10-CM | POA: Diagnosis not present

## 2021-05-02 LAB — CBC WITH DIFFERENTIAL/PLATELET
Abs Immature Granulocytes: 0.03 10*3/uL (ref 0.00–0.07)
Basophils Absolute: 0 10*3/uL (ref 0.0–0.1)
Basophils Relative: 0 %
Eosinophils Absolute: 0.1 10*3/uL (ref 0.0–0.5)
Eosinophils Relative: 1 %
HCT: 39.8 % (ref 36.0–46.0)
Hemoglobin: 12.9 g/dL (ref 12.0–15.0)
Immature Granulocytes: 1 %
Lymphocytes Relative: 28 %
Lymphs Abs: 1.7 10*3/uL (ref 0.7–4.0)
MCH: 29.7 pg (ref 26.0–34.0)
MCHC: 32.4 g/dL (ref 30.0–36.0)
MCV: 91.7 fL (ref 80.0–100.0)
Monocytes Absolute: 0.6 10*3/uL (ref 0.1–1.0)
Monocytes Relative: 9 %
Neutro Abs: 3.7 10*3/uL (ref 1.7–7.7)
Neutrophils Relative %: 61 %
Platelets: 116 10*3/uL — ABNORMAL LOW (ref 150–400)
RBC: 4.34 MIL/uL (ref 3.87–5.11)
RDW: 14.2 % (ref 11.5–15.5)
WBC: 6.1 10*3/uL (ref 4.0–10.5)
nRBC: 0 % (ref 0.0–0.2)

## 2021-05-02 LAB — COMPREHENSIVE METABOLIC PANEL
ALT: 30 U/L (ref 0–44)
AST: 17 U/L (ref 15–41)
Albumin: 3.5 g/dL (ref 3.5–5.0)
Alkaline Phosphatase: 59 U/L (ref 38–126)
Anion gap: 6 (ref 5–15)
BUN: 26 mg/dL — ABNORMAL HIGH (ref 8–23)
CO2: 26 mmol/L (ref 22–32)
Calcium: 8.7 mg/dL — ABNORMAL LOW (ref 8.9–10.3)
Chloride: 103 mmol/L (ref 98–111)
Creatinine, Ser: 0.85 mg/dL (ref 0.44–1.00)
GFR, Estimated: >60 mL/min (ref >60)
Glucose, Bld: 132 mg/dL — ABNORMAL HIGH (ref 70–99)
Potassium: 4.6 mmol/L (ref 3.5–5.1)
Sodium: 135 mmol/L (ref 135–145)
Total Bilirubin: 1 mg/dL (ref 0.3–1.2)
Total Protein: 8.6 g/dL — ABNORMAL HIGH (ref 6.5–8.1)

## 2021-05-02 LAB — SAMPLE TO BLOOD BANK

## 2021-05-02 LAB — LACTATE DEHYDROGENASE: LDH: 110 U/L (ref 98–192)

## 2021-05-02 NOTE — Progress Notes (Signed)
Per v/o Dr. Rogue Bussing- sent #Referral to [facility] medical oncology center: [facility] regarding ITP: Fax: [phone]

## 2021-05-02 NOTE — Assessment & Plan Note (Addendum)
#   Acute on chronic ITP-s/p dexamethasone 40 mg x 4 days; IVIG [s/p hospitalization early September 2022; nadir platelets 6].  Today platelets are 116.  #Discussed the need for monitoring of platelet counts closely/weekly.  Can consider Rituxan as patient had received Rituxan approximately 5 years ago-was in remission until recently.  # Lupus- [2012] on plaquenil [Dr.Ishizwar]- labs- weekly cbc; pt will call if dropping.  Patient understands that I do not be getting the results of her CBC/ordered by rheumatology.  Patient is to call us for any treatment options-if she is unable to establish care with hematology oncology locally; [place], [place].  I have asked the patient to call the cancer center-[place] appointment ASAP.  # DISPOSITION: # [place] medical oncology center: [facility] regarding ITP referral to Fax: [phone]  # follow up TBD- Dr.B

## 2021-05-02 NOTE — Progress Notes (Signed)
Pt in for follow up, last seen by Beckey Rutter, NP, Dr Mike Gip prior to that visit.  Pt states took 40 mg prednisone on 04/29/21. Pt states was having some "skin tenderness but improved after prednisone".

## 2021-05-02 NOTE — Progress Notes (Signed)
[facility] OFFICE PROGRESS NOTE  Patient Care Team: Blima Dessert, MD as PCP - General (Rheumatology)  Cancer Staging No matching staging information was found for the patient.  # CHRONIC ITP [Dr.Corcoran s/p Rituxan-2017]; Acute ITP- AUG 2022 [nadir platelets-6]- IVIG; dexamethasone 40 mg.  # Lupus- [2012] on plaquenil [Dr.Ishizwar]  Oncology History   No history exists.      INTERVAL HISTORY: Ambulating independently.  Alone.  Dana [name] 60 y.o.  female pleasant patient above history of chronic ITP was recently admitted to hospital for acute ITP platelets/platelets 6.  Evaluated by my partner Dr. Tasia Catchings.  Patient received dexamethasone 40 daily x4; and also IVIG in the hospital.  Patient's platelets today above 100.   Denies any easy bruising or bleeding.  Patient has recently moved to [facility] related [place] her job.  She is looking for establishing hematology consultation locally.  Review of Systems  Constitutional:  Negative for chills, diaphoresis, fever, malaise/fatigue and weight loss.  HENT:  Negative for nosebleeds and sore throat.   Eyes:  Negative for double vision.  Respiratory:  Negative for cough, hemoptysis, sputum production, shortness of breath and wheezing.   Cardiovascular:  Negative for chest pain, palpitations, orthopnea and leg swelling.  Gastrointestinal:  Negative for abdominal pain, blood in stool, constipation, diarrhea, heartburn, melena, nausea and vomiting.  Genitourinary:  Negative for dysuria, frequency and urgency.  Musculoskeletal:  Negative for back pain and joint pain.  Skin: Negative.  Negative for itching and rash.  Neurological:  Negative for dizziness, tingling, focal weakness, weakness and headaches.  Endo/Heme/Allergies:  Does not bruise/bleed easily.  Psychiatric/Behavioral:  Negative for depression. The patient is not nervous/anxious and does not have insomnia.      PAST MEDICAL HISTORY :   Past Medical History:  Diagnosis Date  . Eye exam normal 02/04/2017  . Hemorrhoid   . Hx of mammogram   . ITP (idiopathic thrombocytopenic purpura)   . Lupus ([place])   . Pap smear abnormality of cervix 12/03/2016  . Positive colorectal cancer screening using Cologuard test 02/08/2017  . Uterine fibroid     PAST SURGICAL HISTORY :   Past Surgical History:  Procedure Laterality Date  . CARPAL TUNNEL RELEASE     bilateral  . PELVIC LAPAROSCOPY      FAMILY HISTORY :   Family History  Problem Relation Age of Onset  . Hypertension Mother   . Cancer Father        porstate  . Diabetes Father   . Hypertension Sister     SOCIAL HISTORY:   Social History   Tobacco Use  . Smoking status: Never  . Smokeless tobacco: Never  Substance Use Topics  . Alcohol use: No  . Drug use: No    ALLERGIES:  is allergic to aspirin, ibuprofen, latex, morphine and related, protonix [pantoprazole sodium], and buprenorphine hcl.  MEDICATIONS:  Current Outpatient Medications  Medication Sig Dispense Refill  . acetaminophen (TYLENOL) 500 MG tablet Take 500 mg by mouth every 6 (six) hours as needed for mild pain, moderate pain, fever or headache.     . hydroxychloroquine (PLAQUENIL) 200 MG tablet Take 1 tablet by mouth daily.    . Multiple Vitamin (MULTIVITAMIN) tablet Take 1 tablet by mouth daily.     No current facility-administered medications for this visit.    PHYSICAL EXAMINATION: ECOG PERFORMANCE STATUS: 0 - Asymptomatic  BP 140/72 (BP Location: Left Arm, Patient Position: Sitting)   Pulse 72  Temp 97.8 F (36.6 C) (Tympanic)   Resp 16   Wt 144 lb (65.3 kg)   SpO2 100%   BMI 22.22 kg/m   Filed Weights   05/02/21 1121  Weight: 144 lb (65.3 kg)    Physical Exam Vitals and nursing note reviewed.  HENT:     Head: Normocephalic and atraumatic.     Mouth/Throat:     Pharynx: Oropharynx is clear.  Eyes:     Extraocular Movements: Extraocular movements intact.     Pupils:  Pupils are equal, round, and reactive to light.  Cardiovascular:     Rate and Rhythm: Normal rate and regular rhythm.  Pulmonary:     Comments: Decreased breath sounds bilaterally.  Abdominal:     Palpations: Abdomen is soft.  Musculoskeletal:        General: Normal range of motion.     Cervical back: Normal range of motion.  Skin:    General: Skin is warm.  Neurological:     General: No focal deficit present.     Mental Status: She is alert and oriented to person, place, and time.  Psychiatric:        Behavior: Behavior normal.        Judgment: Judgment normal.      LABORATORY DATA:  I have reviewed the data as listed    Component Value Date/Time   NA 135 05/02/2021 1040   K 4.6 05/02/2021 1040   CL 103 05/02/2021 1040   CO2 26 05/02/2021 1040   GLUCOSE 132 (H) 05/02/2021 1040   BUN 26 (H) 05/02/2021 1040   CREATININE 0.85 05/02/2021 1040   CALCIUM 8.7 (L) 05/02/2021 1040   PROT 8.6 (H) 05/02/2021 1040   ALBUMIN 3.5 05/02/2021 1040   AST 17 05/02/2021 1040   ALT 30 05/02/2021 1040   ALKPHOS 59 05/02/2021 1040   BILITOT 1.0 05/02/2021 1040   GFRNONAA >60 05/02/2021 1040   GFRAA >60 02/29/2020 0850    No results found for: SPEP, UPEP  Lab Results  Component Value Date   WBC 6.1 05/02/2021   NEUTROABS 3.7 05/02/2021   HGB 12.9 05/02/2021   HCT 39.8 05/02/2021   MCV 91.7 05/02/2021   PLT 116 (L) 05/02/2021      Chemistry      Component Value Date/Time   NA 135 05/02/2021 1040   K 4.6 05/02/2021 1040   CL 103 05/02/2021 1040   CO2 26 05/02/2021 1040   BUN 26 (H) 05/02/2021 1040   CREATININE 0.85 05/02/2021 1040      Component Value Date/Time   CALCIUM 8.7 (L) 05/02/2021 1040   ALKPHOS 59 05/02/2021 1040   AST 17 05/02/2021 1040   ALT 30 05/02/2021 1040   BILITOT 1.0 05/02/2021 1040       RADIOGRAPHIC STUDIES: I have personally reviewed the radiological images as listed and agreed with the findings in the report. No results found.    ASSESSMENT & PLAN:  Idiopathic thrombocytopenic purpura ([place]) # Acute on chronic ITP-s/p dexamethasone 40 mg x 4 days; IVIG [s/p hospitalization early September 2022; nadir platelets 6].  Today platelets are 116.  #Discussed the need for monitoring of platelet counts closely/weekly.  Can consider Rituxan as patient had received Rituxan approximately 5 years ago-was in remission until recently.  # Lupus- [2012] on plaquenil [Dr.Ishizwar]- labs- weekly cbc; pt will call if dropping.  Patient understands that I do not be getting the results of her CBC/ordered by rheumatology.  Patient is to call  us for any treatment options-if she is unable to establish care with hematology oncology locally; [place], [place].  I have asked the patient to call the cancer center-[place] appointment ASAP.  # DISPOSITION: # [place] medical oncology center: [facility] regarding ITP referral to Fax: [phone]  # follow up TBD- Dr.B   Orders Placed This Encounter  Procedures  . Ambulatory referral to Hematology / Oncology    Referral Priority:   Urgent    Referral Type:   Consultation    Referral Reason:   Specialty Services Required    Requested Specialty:   Oncology    Number of Visits Requested:   1    All questions were answered. The patient knows to call the clinic with any problems, questions or concerns.      Cammie Sickle, MD 05/02/2021 7:10 PM

## 2022-06-05 NOTE — Telephone Encounter (Signed)
Signing encounter, see previous note 02/06/20
